# Patient Record
Sex: Female | Born: 1940
Health system: Southern US, Community
[De-identification: ages and names within clinical notes are randomized; demographics above are authoritative.]

## PROBLEM LIST (undated history)

## (undated) DIAGNOSIS — Z974 Presence of external hearing-aid: Secondary | ICD-10-CM

## (undated) DIAGNOSIS — T7840XA Allergy, unspecified, initial encounter: Secondary | ICD-10-CM

## (undated) DIAGNOSIS — M1712 Unilateral primary osteoarthritis, left knee: Secondary | ICD-10-CM

## (undated) DIAGNOSIS — M797 Fibromyalgia: Secondary | ICD-10-CM

## (undated) DIAGNOSIS — E785 Hyperlipidemia, unspecified: Secondary | ICD-10-CM

## (undated) DIAGNOSIS — B359 Dermatophytosis, unspecified: Secondary | ICD-10-CM

## (undated) DIAGNOSIS — E119 Type 2 diabetes mellitus without complications: Secondary | ICD-10-CM

## (undated) DIAGNOSIS — J45909 Unspecified asthma, uncomplicated: Secondary | ICD-10-CM

## (undated) DIAGNOSIS — G4730 Sleep apnea, unspecified: Secondary | ICD-10-CM

## (undated) DIAGNOSIS — E669 Obesity, unspecified: Secondary | ICD-10-CM

## (undated) DIAGNOSIS — M1610 Unilateral primary osteoarthritis, unspecified hip: Secondary | ICD-10-CM

## (undated) DIAGNOSIS — M722 Plantar fascial fibromatosis: Secondary | ICD-10-CM

## (undated) DIAGNOSIS — K219 Gastro-esophageal reflux disease without esophagitis: Secondary | ICD-10-CM

## (undated) DIAGNOSIS — I10 Essential (primary) hypertension: Secondary | ICD-10-CM

## (undated) HISTORY — DX: Fibromyalgia: M79.7

## (undated) HISTORY — DX: Plantar fascial fibromatosis: M72.2

## (undated) HISTORY — DX: Unilateral primary osteoarthritis, unspecified hip: M16.10

## (undated) HISTORY — DX: Unspecified asthma, uncomplicated: J45.909

## (undated) HISTORY — DX: Unilateral primary osteoarthritis, left knee: M17.12

## (undated) HISTORY — DX: Allergy, unspecified, initial encounter: T78.40XA

## (undated) HISTORY — DX: Essential (primary) hypertension: I10

## (undated) HISTORY — PX: BILATERAL CARPAL TUNNEL RELEASE: SHX6508

## (undated) HISTORY — DX: Type 2 diabetes mellitus without complications: E11.9

## (undated) HISTORY — DX: Dermatophytosis, unspecified: B35.9

## (undated) HISTORY — PX: HALLUX VALGUS CORRECTION: SUR315

## (undated) HISTORY — DX: Hyperlipidemia, unspecified: E78.5

## (undated) HISTORY — DX: Obesity, unspecified: E66.9

## (undated) HISTORY — PX: DILATION AND CURETTAGE OF UTERUS: SHX78

---

## 2004-04-11 ENCOUNTER — Other Ambulatory Visit: Payer: Self-pay

## 2010-11-14 ENCOUNTER — Ambulatory Visit: Payer: Self-pay

## 2011-11-06 ENCOUNTER — Ambulatory Visit: Payer: Self-pay

## 2011-11-19 ENCOUNTER — Ambulatory Visit: Payer: Self-pay | Admitting: Obstetrics and Gynecology

## 2011-11-19 DIAGNOSIS — I10 Essential (primary) hypertension: Secondary | ICD-10-CM

## 2011-11-19 LAB — BASIC METABOLIC PANEL
Anion Gap: 7 (ref 7–16)
Calcium, Total: 9.6 mg/dL (ref 8.5–10.1)
Co2: 31 mmol/L (ref 21–32)
Creatinine: 0.82 mg/dL (ref 0.60–1.30)
EGFR (African American): >60
EGFR (Non-African Amer.): >60
Glucose: 106 mg/dL — ABNORMAL HIGH (ref 65–99)
Osmolality: 283 (ref 275–301)

## 2011-11-19 LAB — URINALYSIS, COMPLETE
Blood: NEGATIVE
Glucose,UR: NEGATIVE mg/dL (ref 0–75)
Ketone: NEGATIVE
Nitrite: NEGATIVE
Ph: 6 (ref 4.5–8.0)
Protein: NEGATIVE
WBC UR: 46 /HPF (ref 0–5)

## 2011-11-19 LAB — HEMOGLOBIN: HGB: 13.9 g/dL (ref 12.0–16.0)

## 2011-11-26 ENCOUNTER — Ambulatory Visit: Payer: Self-pay | Admitting: Obstetrics and Gynecology

## 2011-11-27 LAB — PATHOLOGY REPORT

## 2012-01-26 HISTORY — PX: ABDOMINAL HYSTERECTOMY: SHX81

## 2012-02-05 ENCOUNTER — Ambulatory Visit: Payer: Self-pay | Admitting: Obstetrics and Gynecology

## 2012-02-05 LAB — URINALYSIS, COMPLETE
Bilirubin,UR: NEGATIVE
Blood: NEGATIVE
Glucose,UR: NEGATIVE mg/dL (ref 0–75)
Ketone: NEGATIVE
Leukocyte Esterase: NEGATIVE
Nitrite: NEGATIVE
Protein: NEGATIVE
Specific Gravity: 1.006 (ref 1.003–1.030)
WBC UR: <1 /HPF (ref 0–5)

## 2012-02-05 LAB — POTASSIUM: Potassium: 4.2 mmol/L (ref 3.5–5.1)

## 2012-02-11 ENCOUNTER — Ambulatory Visit: Payer: Self-pay | Admitting: Obstetrics and Gynecology

## 2012-02-14 LAB — PATHOLOGY REPORT

## 2014-07-07 ENCOUNTER — Ambulatory Visit: Payer: Self-pay | Admitting: Family Medicine

## 2014-12-19 NOTE — Op Note (Signed)
PATIENT NAME:  Carrie Wong, Carrie Wong MR#:  [id number] DATE OF BIRTH:  1940-11-24  DATE OF PROCEDURE:  11/26/2011  PREOPERATIVE DIAGNOSES:  1. Recurrent polyps. 2. Postmenopausal bleeding.   POSTOPERATIVE DIAGNOSES:  1. Recurrent polyps. 2. Postmenopausal bleeding.   PROCEDURES:  1. Hysteroscopy.  2. Dilation and curettage.  3. NovaSure ablation.   SURGEON: Ricky L. Amalia Hailey, MD  ANESTHESIA: General orotracheal.   FINDINGS: Small fleshy polyp minimal curettings otherwise. NovaSure settings showed a cavity length of 4, cavity width of 4.3, power 95 and time of cycle 1 minute, 14 seconds.   ESTIMATED BLOOD LOSS: Minimal.   COMPLICATIONS: None.   DRAINS: In and out catheter with red rubber at the end of the case. Approximately 100 mL of clear urine.   PROCEDURE IN DETAIL: Patient had recurrent polyps and bleeding as noted above. Thickened endometrium on ultrasound. Discussed options. Elected to proceed with above procedure with understanding that if pathology came back worrisome for cancer would come back and do a hysterectomy. Consent signed. Preoperative antibiotics given. Taken to the Operating Room, placed in supine position where anesthesia was initiated and placed in dorsal lithotomy position using candy cane stirrups, prepped and draped in usual sterile fashion. Cervix was visualized, grasped with single-tooth tenaculum, easily dilated to admit a #17 [place] and scope was inserted with findings as noted above and picture of polyp was taken. Polyp forceps then used to remove polyp. Generalized global curetting was carried out with sharp curette and then NovaSure was carried out in the fashion as described above without difficulty. Cavity test was passed first attempt.  All instruments removed. Bladder was drained. Cervix was seen to be hemostatic.   Was noted to be fairly significant relaxation with cervical descensus, rectocele. and a cystocele.  Patient tolerated procedure well, left in  care of anesthesia. Anticipate a routine postoperative course.   ____________________________ Rockey Situ. Amalia Hailey, MD rle:cms D: 11/26/2011 07:55:04 ET T: 11/26/2011 08:24:55 ET JOB#: [id number]  cc: Ricky L. Amalia Hailey, MD, <Dictator> Selmer Dominion MD ELECTRONICALLY SIGNED 11/27/2011 20:54

## 2014-12-19 NOTE — Op Note (Signed)
PATIENT NAME:  Carrie Wong, Carrie Wong MR#:  [id number] DATE OF BIRTH:  08-04-1941  DATE OF PROCEDURE:  02/11/2012  PREOPERATIVE DIAGNOSIS: Complex endometrial hyperplasia.   POSTOPERATIVE DIAGNOSIS: Complex endometrial hyperplasia.   PROCEDURE: Total vaginal hysterectomy.   SURGEON: Ricky L. Amalia Hailey, M.D.   ASSISTANT: Scrub tech.   ANESTHESIA: General endotracheal by Dr. Boston Service.   FINDINGS: Moderate relaxation, senile uterus, grossly normal tubes and ovaries.   ESTIMATED BLOOD LOSS: 200.   COMPLICATIONS: None.   SPECIMENS: Uterus.   PROCEDURE IN DETAIL: The patient was given the diagnosis of complex endometrial hyperplasia. We discussed options and she elected total vaginal hysterectomy. Consent was signed. She was taken to the Operating Room and placed in the supine position where anesthesia was initiated. She was then placed in the dorsal lithotomy position using Allen stirrups, prepped and draped in the usual sterile fashion. The cervix was visualized and grasped with a single-tooth tenaculum and injected circumferentially with approximately 12 mL of dilute vasopressin. A #15 blade was used to score the cervix circumferentially and direct entry was made in the posterior cul-de-sac. The cuff was tagged and a long weighted was placed.   We then made entry anteriorly and proceeded, using Heaney-Ballantine and 0 Vicryl, in a clamp, cut and tie technique to deliver the uterus in the usual fashion. Stumps were inspected for hemostasis and seen to be excellent. The fascia was closed with running interlocking 0 Vicryl.   A Foley catheter was placed at the end of the case with good spillage of clear urine.   The patient tolerated the procedure well. All instrument, needle, and sponge counts were correct. I anticipate a routine postoperative course and await permanent pathology.  ____________________________ Rockey Situ. Amalia Hailey, MD rle:slb D: 02/11/2012 10:19:53 ET T: 02/11/2012 11:42:32  ET JOB#: [id number]  cc: Ricky L. Amalia Hailey, MD, <Dictator> Selmer Dominion MD ELECTRONICALLY SIGNED 02/13/2012 4:44

## 2015-02-03 ENCOUNTER — Ambulatory Visit (INDEPENDENT_AMBULATORY_CARE_PROVIDER_SITE_OTHER): Payer: Medicare Other | Admitting: Family Medicine

## 2015-02-03 ENCOUNTER — Other Ambulatory Visit: Payer: Self-pay | Admitting: Family Medicine

## 2015-02-03 ENCOUNTER — Encounter: Payer: Self-pay | Admitting: Family Medicine

## 2015-02-03 VITALS — BP 131/78 | HR 61 | Temp 97.8°F | Wt 237.0 lb

## 2015-02-03 DIAGNOSIS — I10 Essential (primary) hypertension: Secondary | ICD-10-CM | POA: Insufficient documentation

## 2015-02-03 DIAGNOSIS — E785 Hyperlipidemia, unspecified: Secondary | ICD-10-CM

## 2015-02-03 DIAGNOSIS — M797 Fibromyalgia: Secondary | ICD-10-CM

## 2015-02-03 DIAGNOSIS — M1712 Unilateral primary osteoarthritis, left knee: Secondary | ICD-10-CM | POA: Insufficient documentation

## 2015-02-03 DIAGNOSIS — J302 Other seasonal allergic rhinitis: Secondary | ICD-10-CM

## 2015-02-03 DIAGNOSIS — D485 Neoplasm of uncertain behavior of skin: Secondary | ICD-10-CM

## 2015-02-03 DIAGNOSIS — J309 Allergic rhinitis, unspecified: Secondary | ICD-10-CM | POA: Insufficient documentation

## 2015-02-03 MED ORDER — MONTELUKAST SODIUM 10 MG PO TABS: 10.0000 mg | ORAL_TABLET | Freq: Every day | ORAL | Status: DC

## 2015-02-03 MED ORDER — AZELASTINE-FLUTICASONE 137-50 MCG/ACT NA SUSP: 1.0000 | Freq: Two times a day (BID) | NASAL | Status: AC

## 2015-02-03 MED ORDER — ESCITALOPRAM OXALATE 10 MG PO TABS: 15.0000 mg | ORAL_TABLET | Freq: Every day | ORAL | Status: DC

## 2015-02-03 MED ORDER — LOSARTAN POTASSIUM-HCTZ 100-25 MG PO TABS: 1.0000 | ORAL_TABLET | Freq: Every day | ORAL | Status: DC

## 2015-02-03 MED ORDER — METOPROLOL SUCCINATE ER 50 MG PO TB24: 50.0000 mg | ORAL_TABLET | Freq: Every day | ORAL | Status: DC

## 2015-02-03 NOTE — Assessment & Plan Note (Signed)
Continue meds; check creatinine and K+; weight loss encouraged

## 2015-02-03 NOTE — Patient Instructions (Addendum)
Please do contact a counselor for therapy regarding the anxiety and stress and sleep issues I do not recommend increasing alcohol or using sleeping pills Okay to use 2,000 mg of Tylenol (acetaminophen) a day for aches and pain I do recommend water therapy for weight loss and fibromyalgia Let's see what your cholesterol is today and then we'll start a new medicine and then recheck your cholesterol after 8-10 weeks Do limit eggs to no more than 3 per week Avoid / limit saturated fats, and try to get more whole grains and vegetables and fruits Do call to schedule your own mammogram please We'll refer you to the dermatologist Okay to add claritin to the generic Singulair   Cholesterol Cholesterol is a fat. Your body needs a small amount of cholesterol. Cholesterol may build up in your blood vessels. This increases your chance of having a heart attack or stroke. You cannot feel your cholesterol levels. The only way to know your cholesterol level is high is with a blood test. Keep your test results. Work with your doctor to keep your cholesterol at a good level. WHAT DO THE TEST RESULTS MEAN?  Total cholesterol is how much cholesterol is in your blood.  LDL is bad cholesterol. This is the type that can build up. You want LDL to be low.  HDL is good cholesterol. It cleans your blood vessels and carries LDL away. You want HDL to be high.  Triglycerides are fat that the body can burn for energy or store. WHAT ARE GOOD LEVELS OF CHOLESTEROL?  Total cholesterol below 200.  LDL below 100 for people at risk. Below 70 for those at very high risk.  HDL above 50 is good. Above 60 is best.  Triglycerides below 150. HOW CAN I LOWER MY CHOLESTEROL?  Diet. Follow your diet programs as told by your doctor.  Choose fish, white meat chicken, roasted [place], or baked [place]. Try not to eat red meat, fried foods, or processed meats such as sausage and lunch meats.  Eat lots of fresh fruits and  vegetables.  Choose whole grains, beans, pasta, potatoes, and cereals.  Use only small amounts of olive, corn, or canola oils.  Try not to eat butter, mayonnaise, shortening, or palm kernel oils.  Try not to eat foods with trans fats.  Drink skim or nonfat milk. Eat low-fat or nonfat yogurt and cheeses. Try not to drink whole milk or cream. Try not to eat ice cream, egg yolks, and full-fat cheeses.  Healthy desserts include angel food cake, ginger snaps, animal crackers, hard candy, popsicles, and low-fat or nonfat frozen yogurt. Try not to eat pastries, cakes, pies, and cookies.  Exercise. Follow your exercise programs as told by your doctor.  Be more active. You can try gardening, walking, or taking the stairs. Ask your doctor about how you can be more active.  Medicine. Take medicine as told by your doctor. Document Released: 11/09/2008 Document Revised: 12/28/2013 Document Reviewed: 05/27/2013 [facility] Patient Information 2015 [place], [place]. This information is not intended to replace advice given to you by your health care provider. Make sure you discuss any questions you have with your health care provider.

## 2015-02-03 NOTE — Assessment & Plan Note (Signed)
Continue the montelukast; may add OTC claritin, explained different steps in pathway, okay to take together

## 2015-02-03 NOTE — Telephone Encounter (Signed)
Waiting on the lipid panel and liver function tests to come back, and then I'll prescribe cholesterol medicine

## 2015-02-03 NOTE — Assessment & Plan Note (Signed)
Explained the number one recommendation is weight loss; suggested pool therapy; tylenol is safe for her to take up to 2,000 mg daily; may continue turmeric; she declined offer for additional imaging

## 2015-02-03 NOTE — Progress Notes (Signed)
Patient ID: Carrie Wong, female   DOB: 15-Mar-1941, 74 y.o.   MRN: [id number]   Subjective:   Carrie Wong is a 74 y.o. female here for follow-up today  She has several issues She says she needs "something for anxiety" and wants to know if I can give her what her sister takes; she has duloxetine 30 mg and clonazepam 1 mg written down on a piece of paper; she has trouble getting ready to go to sleep, but once she falls asleep, seems to be able to stay asleep; she does not do her bills at bedtime, but does spend time on the computer before bed; she drinks one alcoholic drink per day  She has pain in her left knee from arthritis; she has had imaging done and does not want any more xrays; there is really not much pain when she is just seated; using a cane; taking turmeric; no Aleve; just an occasional tyelnol; she can't exercise; she also has plantar fasciitis in her right foot and has seen podiatrist; she had right hip pain but that pain is better  She has high cholesterol but has not been taking her medicine because of cost; she asked if she could try another generic medicine which would be cheaper; she eats about 6 eggs per week, limits saturated fats somewhat; she drinks skim milk  She has some bumps under her breasts; on the skin, not in the breast; she has not had her mammogram yet (not UTD)  Allergies are bothersome and she wants to know if she can switch from the montelukast to Claritin  Past Medical History  Diagnosis Date  . Obesity   . Diabetes mellitus without complication   . Hyperlipidemia   . Hypertension   . Allergy   . Asthma   . Fibromyalgia   . Tinea   . Plantar fasciitis   . Hip arthritis   . Arthritis of knee, left   . Fibromyalgia    Past Surgical History  Procedure Laterality Date  . Dilation and curettage of uterus    . Abdominal hysterectomy  June 2013    Total vaginal for precancerous cells (ovaries remain)   Family History  Problem Relation Age  of Onset  . Hypertension Mother   . Heart disease Mother 23    Bypass surgery  . Cancer Father     lung  . Heart disease Father 59    bypass surgery   History  Substance Use Topics  . Smoking status: Former Smoker -- 0.50 packs/day for 10 years    Types: Cigarettes    Quit date: 12/26/1966  . Smokeless tobacco: Never Used  . Alcohol Use: 4.2 oz/week    7 Cans of beer per week     Comment: "drinks for her kidneys"     Current outpatient prescriptions:  .  aspirin EC 81 MG tablet, Take 81 mg by mouth daily., Disp: , Rfl:  .  Azelastine-Fluticasone 137-50 MCG/ACT SUSP, Place into the nose., Disp: , Rfl:  .  clotrimazole-betamethasone (LOTRISONE) cream, Apply topically., Disp: , Rfl:  .  escitalopram (LEXAPRO) 10 MG tablet, Take 10 mg by mouth daily. Take 1.5 tabs daily, Disp: , Rfl:  .  losartan-hydrochlorothiazide (HYZAAR) 100-25 MG per tablet, Take 1 tablet by mouth daily., Disp: , Rfl:  .  Melatonin 3 MG CAPS, Take by mouth at bedtime., Disp: , Rfl:  .  metoprolol succinate (TOPROL-XL) 50 MG 24 hr tablet, Take 50 mg by mouth daily. Take  with or immediately following a meal., Disp: , Rfl:  .  montelukast (SINGULAIR) 10 MG tablet, Take 10 mg by mouth at bedtime., Disp: , Rfl:  .  rosuvastatin (CRESTOR) 5 MG tablet, Take 5 mg by mouth daily. Take 1 every other night, Disp: , Rfl:     NOT taking rosuvastatin  Allergies  Allergen Reactions  . Amlodipine Besylate Swelling   ------------- Review of Systems  Constitutional: Negative for weight loss.  Musculoskeletal: Positive for myalgias and joint pain (left knee).  Skin: Positive for rash (under the breasts).  Endo/Heme/Allergies: Positive for environmental allergies (using montelukast; asked if okay to take Claritin).  Psychiatric/Behavioral: Negative for depression. The patient is nervous/anxious and has insomnia.     No results found for: CHOL, HDL, LDLCALC, LDLDIRECT, TRIG, CHOLHDL No results found for: HGBA1C No results  found for: Derl Barrow Lab Results  Component Value Date   NA 141 11/19/2011   K 4.2 02/05/2012   CL 103 11/19/2011   CO2 31 11/19/2011   Lab Results  Component Value Date   CREATININE 0.82 11/19/2011   Lab Results  Component Value Date   HGB 13.3 02/05/2012   HCT 35.6 02/12/2012   Objective:   Filed Vitals:   02/03/15 0810  BP: 131/78  Pulse: 61  Temp: 97.8 F (36.6 C)  Weight: 237 lb (107.502 kg)  SpO2: 96%   Body mass index is 38.04 kg/(m^2). Wt Readings from Last 3 Encounters:  02/03/15 237 lb (107.502 kg)  08/06/14 236 lb (107.049 kg)   Physical Exam  Constitutional: She appears well-developed and well-nourished.  obese  HENT:  Head: Normocephalic and atraumatic.  Mucous membranes moist  Eyes: EOM are normal.  Neck: No thyromegaly present.  Cardiovascular: Normal rate, regular rhythm and normal heart sounds.   No murmur heard. Pulmonary/Chest: Effort normal and breath sounds normal. No respiratory distress. She has no wheezes. Right breast exhibits no inverted nipple, no mass, no nipple discharge, no skin change and no tenderness. Left breast exhibits no inverted nipple, no mass, no nipple discharge, no skin change and no tenderness. Breasts are symmetrical.  Abdominal: Soft. She exhibits no distension.  nondistended  Musculoskeletal: She exhibits no edema or tenderness (no pain with palpation over the left knee; there is crepitus with active flexion/extension).  No gross deformities  Neurological: She is alert. Coordination normal.  No tics or tremors  Skin: Skin is warm and dry. No rash (keratotic lesions under both breasts; macular brown lesions, one on each areola (new per patient)) noted.  Psychiatric: Her mood appears not anxious. Her speech is not slurred. She is not agitated, not aggressive and not hyperactive. Cognition and memory are not impaired. She does not exhibit a depressed mood. She exhibits normal recent memory.  Good eye contact  with examiner    Assessment/Plan:   Problem List Items Addressed This Visit      Cardiovascular and Mediastinum   Benign hypertension    Continue meds; check creatinine and K+; weight loss encouraged        Respiratory   Allergic rhinitis    Continue the montelukast; may add OTC claritin, explained different steps in pathway, okay to take together        Musculoskeletal and Integument   Primary osteoarthritis of left knee - Primary    Explained the number one recommendation is weight loss; suggested pool therapy; tylenol is safe for her to take up to 2,000 mg daily; may continue turmeric; she declined offer for  additional imaging      Fibromyalgia     Other   Hyperlipidemia    Will see what today's fasting lipid panel is and then start generic atorvastatin (most likely); limit eggs to no more than 3 per week; limit saturated fats      Relevant Orders   Comprehensive metabolic panel   Lipid panel    Other Visit Diagnoses    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 clotrimazole-betamethasone (LOTRISONE) cream    Sig: Apply topically.  . Melatonin 3 MG CAPS    Sig: Take by mouth at bedtime.   Orders Placed This Encounter  Procedures  . Comprehensive metabolic panel  . Lipid panel  . Ambulatory referral to Dermatology    Referral Priority:  Routine    Referral Type:  Consultation    Referral Reason:  Specialty Services Required    Requested Specialty:  Dermatology    Number of Visits Requested:  1    Follow up plan: Return in about 10 weeks (around 04/14/2015).   An After Visit Summary was printed and given to the patient.

## 2015-02-03 NOTE — Assessment & Plan Note (Signed)
Will see what today's fasting lipid panel is and then start generic atorvastatin (most likely); limit eggs to no more than 3 per week; limit saturated fats

## 2015-02-04 ENCOUNTER — Telehealth: Payer: Self-pay | Admitting: Family Medicine

## 2015-02-04 DIAGNOSIS — E785 Hyperlipidemia, unspecified: Secondary | ICD-10-CM

## 2015-02-04 LAB — COMPREHENSIVE METABOLIC PANEL
A/G RATIO: 1.7 (ref 1.1–2.5)
ALBUMIN: 4.3 g/dL (ref 3.5–4.8)
ALT: 21 IU/L (ref 0–32)
AST: 18 IU/L (ref 0–40)
Alkaline Phosphatase: 52 IU/L (ref 39–117)
BUN/Creatinine Ratio: 19 (ref 11–26)
BUN: 14 mg/dL (ref 8–27)
Bilirubin Total: <0.2 mg/dL (ref 0.0–1.2)
CO2: 27 mmol/L (ref 18–29)
Calcium: 10 mg/dL (ref 8.7–10.3)
Chloride: 99 mmol/L (ref 97–108)
Creatinine, Ser: 0.73 mg/dL (ref 0.57–1.00)
GFR calc Af Amer: 95 mL/min/{1.73_m2} (ref >59)
GFR calc non Af Amer: 83 mL/min/{1.73_m2} (ref >59)
GLOBULIN, TOTAL: 2.6 g/dL (ref 1.5–4.5)
Glucose: 113 mg/dL — ABNORMAL HIGH (ref 65–99)
POTASSIUM: 3.9 mmol/L (ref 3.5–5.2)
Sodium: 140 mmol/L (ref 134–144)
Total Protein: 6.9 g/dL (ref 6.0–8.5)

## 2015-02-04 LAB — LIPID PANEL
Chol/HDL Ratio: 5.9 ratio units — ABNORMAL HIGH (ref 0.0–4.4)
Cholesterol, Total: 241 mg/dL — ABNORMAL HIGH (ref 100–199)
HDL: 41 mg/dL (ref >39)
LDL Calculated: 155 mg/dL — ABNORMAL HIGH (ref 0–99)
TRIGLYCERIDES: 223 mg/dL — AB (ref 0–149)
VLDL Cholesterol Cal: 45 mg/dL — ABNORMAL HIGH (ref 5–40)

## 2015-02-04 MED ORDER — SIMVASTATIN 10 MG PO TABS: 10.0000 mg | ORAL_TABLET | Freq: Every day | ORAL | Status: DC

## 2015-02-04 NOTE — Telephone Encounter (Signed)
Let pt know cholesterol is high, but liver function tests are normal Let's start low dose simvastatin (already sent Rx) Return for FASTING labs in 8 weeks (orders entered) Really work on low fat diet, limiting saturated fats, more fiber and veggies, weight loss

## 2015-02-04 NOTE — Telephone Encounter (Signed)
Pt. Notified.

## 2015-03-29 ENCOUNTER — Other Ambulatory Visit: Payer: Self-pay | Admitting: Family Medicine

## 2015-03-30 ENCOUNTER — Other Ambulatory Visit: Payer: Self-pay

## 2015-03-30 NOTE — Telephone Encounter (Signed)
Needs 90 day supply for mail order. 

## 2015-03-30 NOTE — Telephone Encounter (Signed)
She had rx's sent to local pharmacy, needs them sent to mail order.

## 2015-03-31 MED ORDER — SIMVASTATIN 10 MG PO TABS: 10.0000 mg | ORAL_TABLET | Freq: Every day | ORAL | Status: DC

## 2015-03-31 NOTE — Telephone Encounter (Signed)
Please make sure she comes in for her labs in August

## 2015-04-14 ENCOUNTER — Encounter: Payer: Self-pay | Admitting: Family Medicine

## 2015-04-14 ENCOUNTER — Ambulatory Visit (INDEPENDENT_AMBULATORY_CARE_PROVIDER_SITE_OTHER): Payer: Medicare Other | Admitting: Family Medicine

## 2015-04-14 VITALS — BP 138/79 | HR 64 | Temp 98.7°F | Ht 66.0 in | Wt 239.0 lb

## 2015-04-14 DIAGNOSIS — I10 Essential (primary) hypertension: Secondary | ICD-10-CM | POA: Diagnosis not present

## 2015-04-14 DIAGNOSIS — Z5181 Encounter for therapeutic drug level monitoring: Secondary | ICD-10-CM | POA: Insufficient documentation

## 2015-04-14 DIAGNOSIS — J45909 Unspecified asthma, uncomplicated: Secondary | ICD-10-CM | POA: Insufficient documentation

## 2015-04-14 DIAGNOSIS — E785 Hyperlipidemia, unspecified: Secondary | ICD-10-CM | POA: Diagnosis not present

## 2015-04-14 DIAGNOSIS — N393 Stress incontinence (female) (male): Secondary | ICD-10-CM | POA: Insufficient documentation

## 2015-04-14 DIAGNOSIS — Z1239 Encounter for other screening for malignant neoplasm of breast: Secondary | ICD-10-CM | POA: Diagnosis not present

## 2015-04-14 DIAGNOSIS — J3089 Other allergic rhinitis: Secondary | ICD-10-CM

## 2015-04-14 LAB — LIPID PANEL PICCOLO, WAIVED
Chol/HDL Ratio Piccolo,Waive: 5.3 mg/dL — ABNORMAL HIGH
Cholesterol Piccolo, Waived: 201 mg/dL — ABNORMAL HIGH
HDL Chol Piccolo, Waived: 38 mg/dL — ABNORMAL LOW
LDL Chol Calc Piccolo Waived: 127 mg/dL — ABNORMAL HIGH
Triglycerides Piccolo,Waived: 182 mg/dL — ABNORMAL HIGH
VLDL Chol Calc Piccolo,Waive: 36 mg/dL — ABNORMAL HIGH

## 2015-04-14 LAB — ALT (SGPT) PICCOLO, WAIVED: ALT (SGPT) PICCOLO, WAIVED: 24 U/L (ref 10–47)

## 2015-04-14 MED ORDER — SIMVASTATIN 10 MG PO TABS: 10.0000 mg | ORAL_TABLET | Freq: Every day | ORAL | Status: DC

## 2015-04-14 NOTE — Assessment & Plan Note (Signed)
Continue same meds; controlled today; DASH guidelines, modest weight loss

## 2015-04-14 NOTE — Assessment & Plan Note (Signed)
Having a little flare now affecting postnasal drip

## 2015-04-14 NOTE — Assessment & Plan Note (Signed)
Wearing pads; not interfering with activities; do Kegel exercise

## 2015-04-14 NOTE — Progress Notes (Signed)
BP 138/79 mmHg  Pulse 64  Temp(Src) 98.7 F (37.1 C)  Ht 5\' 6"  (1.676 m)  Wt 239 lb (108.41 kg)  BMI 38.59 kg/m2  SpO2 97%   Subjective:    Patient ID: Carrie Wong, female    DOB: 03-30-1941, 74 y.o.   MRN: [id number]  HPI: Carrie Wong is a 74 y.o. female  Chief Complaint  Patient presents with  . Hyperlipidemia   She was here in June and was found to have high cholesterol; started on medicine simvastatin; no side effects like muscle aches or abdominal She is doing better with eggs and saturated fat intake; she is limiting eggs to no more than three per week; that was a habit for her, to have two eggs at a time three times a week or more often; if she didn't have eggs in the morning, would have them later in the day She did come fasting this morning Her blood pressure is controlled on medications  She apparently had a Medicare Wellness visit done by [employer] on April 07, 2015 and she brought in several pages of information She is supposed to follow-up on 1.  Possible memory impairment 2.  Abnormal depression screening 3.  Increased risk of falling 4.  Bladder concerns 5.  Chronic pain 6.  Sleeping problems We covered all of these at her other visit actually She is also supposed to supply me with a copy of her advanced directives  Her bladder issues now are "normal"; wears a pad because she coughs and loses a little urine then She was on pills for depression and they weren't doing that much good, then the dose was increased to 1.5 pills but her insurance wouldn't pay for that dose, then they sent it to her, then she started taking it finally; 1.5 pills daily; she decided to do counseling instead of medication; she does not feel depressed; does not feel sad; she wasn't sleeping as well because of the underlying mood issue; she has started with a friend, Darrick Meigs friend and mentor, not a Social worker; has helped her through some hard times; not  feeling depressed She does not feel like she is having any major problems with her memory; might forget a word; happens to everybody; she does not get lost while driving  Relevant past medical, surgical, family and social history reviewed and updated as indicated. Interim medical history since our last visit reviewed. Allergies and medications reviewed and updated.  Review of Systems  Constitutional: Negative for fever and chills.  HENT: Positive for postnasal drip.        Dry throat  Eyes: Positive for discharge (watering).  Respiratory: Positive for cough (a little productive just recently) and wheezing (just a little wheeze recently with postnasal drip).   Psychiatric/Behavioral: Negative for dysphoric mood.   Per HPI unless specifically indicated above     Objective:    BP 138/79 mmHg  Pulse 64  Temp(Src) 98.7 F (37.1 C)  Ht 5\' 6"  (1.676 m)  Wt 239 lb (108.41 kg)  BMI 38.59 kg/m2  SpO2 97%  Wt Readings from Last 3 Encounters:  04/14/15 239 lb (108.41 kg)  02/03/15 237 lb (107.502 kg)  08/06/14 236 lb (107.049 kg)    Physical Exam  Constitutional: She appears well-developed and well-nourished. No distress.  HENT:  Head: Normocephalic and atraumatic.  Eyes: EOM are normal. No scleral icterus.  Neck: No thyromegaly present.  Cardiovascular: Normal rate, regular rhythm and normal heart sounds.  No murmur heard. Pulmonary/Chest: Effort normal and breath sounds normal. No respiratory distress. She has no wheezes. She has no rales.  Abdominal: Soft. Bowel sounds are normal. She exhibits no distension.  Musculoskeletal: Normal range of motion. She exhibits no edema.  Lymphadenopathy:    She has no cervical adenopathy.  Neurological: She is alert. She exhibits normal muscle tone.  Skin: Skin is warm and dry. She is not diaphoretic. No pallor.  Psychiatric: She has a normal mood and affect. Her behavior is normal. Judgment and thought content normal.   Results for orders  placed or performed in visit on 02/03/15  Comprehensive metabolic panel  Result Value Ref Range   Glucose 113 (H) 65 - 99 mg/dL   BUN 14 8 - 27 mg/dL   Creatinine, Ser 0.73 0.57 - 1.00 mg/dL   GFR calc non Af Amer 83 >59 mL/min/1.73   GFR calc Af Amer 95 >59 mL/min/1.73   BUN/Creatinine Ratio 19 11 - 26   Sodium 140 134 - 144 mmol/L   Potassium 3.9 3.5 - 5.2 mmol/L   Chloride 99 97 - 108 mmol/L   CO2 27 18 - 29 mmol/L   Calcium 10.0 8.7 - 10.3 mg/dL   Total Protein 6.9 6.0 - 8.5 g/dL   Albumin 4.3 3.5 - 4.8 g/dL   Globulin, Total 2.6 1.5 - 4.5 g/dL   Albumin/Globulin Ratio 1.7 1.1 - 2.5   Bilirubin Total <0.2 0.0 - 1.2 mg/dL   Alkaline Phosphatase 52 39 - 117 IU/L   AST 18 0 - 40 IU/L   ALT 21 0 - 32 IU/L  Lipid panel  Result Value Ref Range   Cholesterol, Total 241 (H) 100 - 199 mg/dL   Triglycerides 223 (H) 0 - 149 mg/dL   HDL 41 >39 mg/dL   VLDL Cholesterol Cal 45 (H) 5 - 40 mg/dL   LDL Calculated 155 (H) 0 - 99 mg/dL   Chol/HDL Ratio 5.9 (H) 0.0 - 4.4 ratio units      Assessment & Plan:   Problem List Items Addressed This Visit      Cardiovascular and Mediastinum   Benign hypertension    Continue same meds; controlled today; DASH guidelines, modest weight loss      Relevant Medications   simvastatin (ZOCOR) 10 MG tablet     Respiratory   Allergic rhinitis    Having a little flare now affecting postnasal drip      Asthma due to environmental allergies    Avoid triggers; use singulair and rescue inhaler        Other   Hyperlipidemia - Primary    Check lipids today; continue statin; limit eggs and saturated fats; LDL today down from 163 to 127; SGPT normal      Relevant Medications   simvastatin (ZOCOR) 10 MG tablet   Other Relevant Orders   Lipid Panel Piccolo, Waived   Medication monitoring encounter    Check liver enzymes today      Relevant Orders   ALT (SGPT) Piccolo, Waived   Breast cancer screening    Order entered for mammogram       Relevant Orders   MM DIGITAL SCREENING BILATERAL   Urinary, incontinence, 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  An after-visit summary was printed and given to the patient at [facility].  Please see the  patient instructions which may contain other information and recommendations beyond what is mentioned above in the assessment and plan.  Meds ordered this encounter  Medications  . simvastatin (ZOCOR) 10 MG tablet    Sig: Take 1 tablet (10 mg total) by mouth at bedtime.    Dispense:  90 tablet    Refill:  1   Orders Placed This Encounter  Procedures  . MM DIGITAL SCREENING BILATERAL  . Lipid Panel Piccolo, [place]  . ALT (SGPT) Piccolo, [place]

## 2015-04-14 NOTE — Patient Instructions (Addendum)
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

## 2015-04-14 NOTE — Assessment & Plan Note (Addendum)
Check lipids today; continue statin; limit eggs and saturated fats; LDL today down from 163 to 127; SGPT normal

## 2015-04-14 NOTE — Assessment & Plan Note (Addendum)
Order entered for mammogram.

## 2015-04-14 NOTE — Assessment & Plan Note (Signed)
Check liver enzymes today 

## 2015-04-14 NOTE — Assessment & Plan Note (Signed)
Avoid triggers; use singulair and rescue inhaler

## 2015-06-22 ENCOUNTER — Ambulatory Visit
Admission: RE | Admit: 2015-06-22 | Discharge: 2015-06-22 | Disposition: A | Discharge status: Home / Self Care | Payer: Medicare Other | Source: Ambulatory Visit | Attending: Family Medicine | Admitting: Family Medicine

## 2015-06-22 DIAGNOSIS — Z1231 Encounter for screening mammogram for malignant neoplasm of breast: Secondary | ICD-10-CM | POA: Insufficient documentation

## 2015-06-30 ENCOUNTER — Other Ambulatory Visit: Payer: Self-pay | Admitting: Family Medicine

## 2015-06-30 MED ORDER — METOPROLOL SUCCINATE ER 50 MG PO TB24: 50.0000 mg | ORAL_TABLET | Freq: Every day | ORAL | Status: DC

## 2015-06-30 MED ORDER — LOSARTAN POTASSIUM-HCTZ 100-25 MG PO TABS: 1.0000 | ORAL_TABLET | Freq: Every day | ORAL | Status: DC

## 2015-06-30 MED ORDER — SIMVASTATIN 10 MG PO TABS: 10.0000 mg | ORAL_TABLET | Freq: Every day | ORAL | Status: DC

## 2015-06-30 NOTE — Telephone Encounter (Signed)
I reviewed CMP, June 2016; lipids and SGPT August; I approved some more refills, but need to keep reminders to prevent too much medicine being continued without being monitored

## 2015-06-30 NOTE — Telephone Encounter (Signed)
She would like 90 day rx's with refills for the year. She has a refill remaining on the simvastatin, but I didn't know if you'd be able to give her more refills.

## 2015-06-30 NOTE — Telephone Encounter (Signed)
Patient notified

## 2015-06-30 NOTE — Telephone Encounter (Signed)
Pt needs refills for simvastatin, losartan and metoprolol sent to mail order. She would like to know if she could possibly have it done for a year instead of 90 days.

## 2015-09-26 ENCOUNTER — Other Ambulatory Visit: Payer: Self-pay | Admitting: Family Medicine

## 2015-09-26 NOTE — Telephone Encounter (Signed)
She wants to be seen for Fibromyalgia, since November, she's been worse and not sleeping, was on amitriptyline in the past. She says she does have problems with her knees.

## 2015-09-26 NOTE — Telephone Encounter (Signed)
Patient wants to know if she can get a referral to Rheumatology with Dr. Jefm Bryant. Please call pt [phone].

## 2015-09-27 NOTE — Telephone Encounter (Signed)
I'd recommend that we see her first; I don't know if Dr. Jefm Bryant will see patients for fibromyalgia; schedule her a dedicated visit with me for fibro (separate for her Feb visit for her other medicine problems)

## 2015-09-28 NOTE — Telephone Encounter (Signed)
Left 2 vm for pt to call us back and schedule an appointment for a referral to rheumatology separate from the one she already has schedule.

## 2015-09-29 MED ORDER — MONTELUKAST SODIUM 10 MG PO TABS: 10.0000 mg | ORAL_TABLET | Freq: Every day | ORAL | Status: AC

## 2015-09-29 MED ORDER — METOPROLOL SUCCINATE ER 50 MG PO TB24: 50.0000 mg | ORAL_TABLET | Freq: Every day | ORAL | Status: DC

## 2015-09-29 MED ORDER — SIMVASTATIN 10 MG PO TABS: 10.0000 mg | ORAL_TABLET | Freq: Every day | ORAL | Status: DC

## 2015-09-29 MED ORDER — LOSARTAN POTASSIUM-HCTZ 100-25 MG PO TABS: 1.0000 | ORAL_TABLET | Freq: Every day | ORAL | Status: DC

## 2015-09-29 NOTE — Telephone Encounter (Signed)
Labs from June and August 2016 reviewed; she has upcoming appt; refils approved

## 2015-09-29 NOTE — Telephone Encounter (Signed)
Routing to provider  

## 2015-09-29 NOTE — Telephone Encounter (Signed)
[facility] called stated pt is leaving mail order and transferring to [facility] in [place]. Pharm needs all RX's sent to them as new RX's.  Pt specifically needs: Simvastatin Monotelukast Losartan w/ HCTZ Generic Toporal   Pharm is [facility] in [place]. Thanks.

## 2015-10-18 ENCOUNTER — Ambulatory Visit (INDEPENDENT_AMBULATORY_CARE_PROVIDER_SITE_OTHER): Payer: PPO | Admitting: Family Medicine

## 2015-10-18 ENCOUNTER — Encounter: Payer: Self-pay | Admitting: Family Medicine

## 2015-10-18 VITALS — BP 131/88 | HR 68 | Temp 98.5°F | Ht 66.0 in | Wt 239.8 lb

## 2015-10-18 DIAGNOSIS — E669 Obesity, unspecified: Secondary | ICD-10-CM | POA: Diagnosis not present

## 2015-10-18 DIAGNOSIS — M25519 Pain in unspecified shoulder: Secondary | ICD-10-CM

## 2015-10-18 DIAGNOSIS — G4700 Insomnia, unspecified: Secondary | ICD-10-CM

## 2015-10-18 DIAGNOSIS — M25512 Pain in left shoulder: Secondary | ICD-10-CM

## 2015-10-18 DIAGNOSIS — I10 Essential (primary) hypertension: Secondary | ICD-10-CM

## 2015-10-18 DIAGNOSIS — R7301 Impaired fasting glucose: Secondary | ICD-10-CM | POA: Diagnosis not present

## 2015-10-18 DIAGNOSIS — Z5181 Encounter for therapeutic drug level monitoring: Secondary | ICD-10-CM

## 2015-10-18 DIAGNOSIS — G8929 Other chronic pain: Secondary | ICD-10-CM | POA: Diagnosis not present

## 2015-10-18 DIAGNOSIS — Z1211 Encounter for screening for malignant neoplasm of colon: Secondary | ICD-10-CM | POA: Diagnosis not present

## 2015-10-18 DIAGNOSIS — E785 Hyperlipidemia, unspecified: Secondary | ICD-10-CM | POA: Diagnosis not present

## 2015-10-18 DIAGNOSIS — M797 Fibromyalgia: Secondary | ICD-10-CM

## 2015-10-18 MED ORDER — TRAZODONE HCL 50 MG PO TABS: 25.0000 mg | ORAL_TABLET | Freq: Every evening | ORAL | Status: DC | PRN

## 2015-10-18 NOTE — Assessment & Plan Note (Addendum)
Check glucose and A1c; quit taking metformin on her own; limit simple carbs; work on weight loss

## 2015-10-18 NOTE — Assessment & Plan Note (Signed)
Controlled today on agents; try weight loss and following DASH guidelines

## 2015-10-18 NOTE — Patient Instructions (Addendum)
Let's get some labs today We'll refer you to the rheumatologist  I've ordered the Cologuard for colon cancer screening  If you have not heard anything from my staff in a week about any orders/referrals/studies from today, please contact us here to follow-up [phone]  Try to limit saturated fats in your diet (bologna, hot dogs, barbeque, cheeseburgers, hamburgers, steak, bacon, sausage, cheese, etc.) and get more fresh fruits, vegetables, and whole grains  Your goal blood pressure is less than 140 mmHg on top. Try to follow the DASH guidelines (DASH stands for Dietary Approaches to Stop Hypertension) Try to limit the sodium in your diet.  Ideally, consume less than 1.5 grams (less than 1,500mg ) per day. Do not add salt when cooking or at the table.  Check the sodium amount on labels when shopping, and choose items lower in sodium when given a choice. Avoid or limit foods that already contain a lot of sodium. Eat a diet rich in fruits and vegetables and whole grains.  Check out the information at familydoctor.org entitled "What It Takes to Lose Weight" Try to lose between 1-2 pounds per week by taking in fewer calories and burning off more calories You can succeed by limiting portions, limiting foods dense in calories and fat, becoming more active, and drinking 8 glasses of water a day (64 ounces) Don't skip meals, especially breakfast, as skipping meals may alter your metabolism Do not use over-the-counter weight loss pills or gimmicks that claim rapid weight loss A healthy BMI (or body mass index) is between 18.5 and 24.9 You can calculate your ideal BMI at the [place] website ClubMonetize.fr

## 2015-10-18 NOTE — Assessment & Plan Note (Signed)
Refer back to rheumatologist; discussed importance of sleep

## 2015-10-18 NOTE — Assessment & Plan Note (Signed)
Likely impacting her fibro; start trazodone

## 2015-10-18 NOTE — Assessment & Plan Note (Signed)
Encouraged weight loss; discussed that losing significant weight would help her knees, sugar, BP, lipids, etc.

## 2015-10-18 NOTE — Assessment & Plan Note (Signed)
Check sgpt on the statin 

## 2015-10-18 NOTE — Progress Notes (Signed)
BP 131/88 mmHg  Pulse 68  Temp(Src) 98.5 F (36.9 C)  Ht 5\' 6"  (1.676 m)  Wt 239 lb 12.8 oz (108.773 kg)  BMI 38.72 kg/m2  SpO2 97%   Subjective:    Patient ID: Carrie Wong, female    DOB: 1941-06-06, 75 y.o.   MRN: [id number]  HPI: Carrie Wong is a 75 y.o. female  Chief Complaint  Patient presents with  . Follow-up  . Referral    Talk about referral to Rheumatology. [place].  . Cologuard    Patient wants to do cologuard & not a colonoscopy.    She called about a referral to a rheumatologist; she saw rheum at [facility] 40 years ago, diagnosed with fibromyalgia; she quit taking amitriptyline, because she couldn't tell any difference; she is aching all over; she says everyone she knows with fibro take something for sleep; she tried melatonin, would go to sleep but wake back up in an hour; not able to be active because of pain, she says she has been sitting since Thanksgiving; using compression hose for her legs  Her knees were a major problem, then had trouble with her right hip, sore for months; the hip is all better now; she does not go up and down stairs with groceries any more because of her knees; 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  High cholesterol; taking statin; she came fasting; she does not eat more than 3 eggs per week; limiting foods with saturated fats Lab Results  Component Value Date   CHOL 241*  02/03/2015   Lab Results  Component Value Date   HDL 41 02/03/2015   Lab Results  Component Value Date   LDLCALC 155* 02/03/2015   Lab Results  Component Value Date   TRIG 223* 02/03/2015   Lab Results  Component Value Date   CHOLHDL 5.9* 02/03/2015   No results found for: LDLDIRECT   Relevant past medical, surgical, family and social history reviewed and updated as indicated. Interim medical history since our last visit reviewed. Allergies and medications reviewed and updated.  Review of Systems  Per HPI unless specifically indicated above     Objective:    BP 131/88 mmHg  Pulse 68  Temp(Src) 98.5 F (36.9 C)  Ht 5\' 6"  (1.676 m)  Wt 239 lb 12.8 oz (108.773 kg)  BMI 38.72 kg/m2  SpO2 97%  Wt Readings from Last 3 Encounters:  10/18/15 239 lb 12.8 oz (108.773 kg)  04/14/15 239 lb (108.41 kg)  02/03/15 237 lb (107.502 kg)    Physical Exam  Constitutional: She appears well-developed and well-nourished. No distress.  Eyes: EOM are normal. No scleral icterus.  Neck: No JVD present.  Cardiovascular: Normal rate and regular rhythm.   Pulmonary/Chest: Effort normal and breath sounds normal.  Abdominal: Bowel sounds are normal. She exhibits no distension.  Musculoskeletal:  Left shoulder: She exhibits decreased range of motion and tenderness. She exhibits no swelling, no crepitus and no deformity.  Neurological: She is alert.  Skin: No pallor.  Psychiatric: She has a normal mood and affect.    Results for orders placed or performed in visit on 04/14/15  Lipid Panel Piccolo, [employer]  Result Value Ref Range   Cholesterol Piccolo, Waived 201 (H) <200 mg/dL   HDL Chol Piccolo, Waived 38 (L) >59 mg/dL   Triglycerides Piccolo,Waived 182 (H) <150 mg/dL   Chol/HDL Ratio Piccolo,Waive 5.3 (H) mg/dL   LDL Chol Calc Piccolo Waived 127 (H) <100 mg/dL   VLDL Chol Calc Piccolo,Waive 36 (H) <30 mg/dL  ALT (SGPT) Piccolo, Waived  Result Value Ref Range   ALT (SGPT)  Piccolo, Waived 24 10 - 47 U/L      Assessment & Plan:   Problem List Items Addressed This Visit      Cardiovascular and Mediastinum   Benign hypertension    Controlled today on agents; try weight loss and following DASH guidelines        Endocrine   Impaired fasting glucose    Check glucose and A1c; quit taking metformin on her own; limit simple carbs; work on weight loss      Relevant Orders   Hgb A1c w/o eAG     Musculoskeletal and Integument   Fibromyalgia - Primary    Refer back to rheumatologist; discussed importance of sleep      Relevant Orders   VITAMIN D 25 Hydroxy (Vit-D Deficiency, Fractures)   Ambulatory referral to Rheumatology     Other   Hyperlipidemia    Check lipids today; continue statin; work on weight loss, healthy eating      Relevant Orders   Lipid Panel w/o Chol/HDL Ratio   Medication monitoring encounter    Check sgpt on the statin      Relevant Orders   CBC with Differential/Platelet   Comprehensive metabolic panel   Colon cancer screening   Relevant Orders   Cologuard   Chronic shoulder pain    Refer to rheum first, as she may benefit from injection; no xrays ordered; may need to see ortho next      Relevant Orders   Ambulatory referral to Rheumatology   Insomnia    Likely impacting her fibro; start trazodone      Obesity    Encouraged weight loss; discussed that losing significant weight would help her knees, sugar, BP, lipids, etc.          Follow up plan: Return in about 6 months (around 04/16/2016) for thirty minute follow-up with fasting labs, schedule Medicare visit when due.

## 2015-10-18 NOTE — Assessment & Plan Note (Addendum)
Check lipids today; continue statin; work on weight loss, healthy eating

## 2015-10-18 NOTE — Assessment & Plan Note (Signed)
Refer to rheum first, as she may benefit from injection; no xrays ordered; may need to see ortho next

## 2015-10-19 LAB — COMPREHENSIVE METABOLIC PANEL
ALK PHOS: 59 IU/L (ref 39–117)
ALT: 26 IU/L (ref 0–32)
AST: 26 IU/L (ref 0–40)
Albumin/Globulin Ratio: 1.9 (ref 1.1–2.5)
Albumin: 4.1 g/dL (ref 3.5–4.8)
BILIRUBIN TOTAL: 0.4 mg/dL (ref 0.0–1.2)
BUN/Creatinine Ratio: 17 (ref 11–26)
BUN: 14 mg/dL (ref 8–27)
CHLORIDE: 100 mmol/L (ref 96–106)
CO2: 25 mmol/L (ref 18–29)
Calcium: 9.7 mg/dL (ref 8.7–10.3)
Creatinine, Ser: 0.83 mg/dL (ref 0.57–1.00)
GFR calc Af Amer: 81 mL/min/{1.73_m2} (ref >59)
GFR calc non Af Amer: 71 mL/min/{1.73_m2} (ref >59)
GLUCOSE: 151 mg/dL — AB (ref 65–99)
Globulin, Total: 2.2 g/dL (ref 1.5–4.5)
Potassium: 4.4 mmol/L (ref 3.5–5.2)
Sodium: 140 mmol/L (ref 134–144)
TOTAL PROTEIN: 6.3 g/dL (ref 6.0–8.5)

## 2015-10-19 LAB — LIPID PANEL W/O CHOL/HDL RATIO
Cholesterol, Total: 186 mg/dL (ref 100–199)
HDL: 37 mg/dL — AB (ref >39)
LDL Calculated: 118 mg/dL — ABNORMAL HIGH (ref 0–99)
TRIGLYCERIDES: 153 mg/dL — AB (ref 0–149)
VLDL Cholesterol Cal: 31 mg/dL (ref 5–40)

## 2015-10-19 LAB — CBC WITH DIFFERENTIAL/PLATELET
Basophils Absolute: 0 10*3/uL (ref 0.0–0.2)
Basos: 0 %
EOS (ABSOLUTE): 0.2 10*3/uL (ref 0.0–0.4)
Eos: 2 %
Hematocrit: 41 % (ref 34.0–46.6)
Hemoglobin: 13.5 g/dL (ref 11.1–15.9)
Immature Grans (Abs): 0 10*3/uL (ref 0.0–0.1)
Immature Granulocytes: 0 %
Lymphocytes Absolute: 1.9 10*3/uL (ref 0.7–3.1)
Lymphs: 23 %
MCH: 30.1 pg (ref 26.6–33.0)
MCHC: 32.9 g/dL (ref 31.5–35.7)
MCV: 92 fL (ref 79–97)
Monocytes Absolute: 0.8 10*3/uL (ref 0.1–0.9)
Monocytes: 10 %
Neutrophils Absolute: 5.3 10*3/uL (ref 1.4–7.0)
Neutrophils: 65 %
Platelets: 228 10*3/uL (ref 150–379)
RBC: 4.48 x10E6/uL (ref 3.77–5.28)
RDW: 14 % (ref 12.3–15.4)
WBC: 8.3 10*3/uL (ref 3.4–10.8)

## 2015-10-19 LAB — VITAMIN D 25 HYDROXY (VIT D DEFICIENCY, FRACTURES): VIT D 25 HYDROXY: 20.7 ng/mL — AB (ref 30.0–100.0)

## 2015-10-19 LAB — HGB A1C W/O EAG: Hgb A1c MFr Bld: 6.5 % — ABNORMAL HIGH (ref 4.8–5.6)

## 2015-10-22 ENCOUNTER — Encounter: Payer: Self-pay | Admitting: Family Medicine

## 2015-10-22 DIAGNOSIS — E119 Type 2 diabetes mellitus without complications: Secondary | ICD-10-CM

## 2015-10-22 HISTORY — DX: Type 2 diabetes mellitus without complications: E11.9

## 2015-10-27 ENCOUNTER — Telehealth: Payer: Self-pay | Admitting: Family Medicine

## 2015-10-27 DIAGNOSIS — E119 Type 2 diabetes mellitus without complications: Secondary | ICD-10-CM

## 2015-10-27 NOTE — Telephone Encounter (Signed)
I talked with patient; she says she was diagnosed with diabetes years ago, then got it under better control She is back on her diet; she talked about winter, weight gain, reasons why her sugar went back up She refuses to take metformin I advised that I wanted to see her in the office to discuss her dx, numbers, come up with a plan She says she won't make an appointment; she'll see me in 6 months for her next visit I discussed that she has diabetes, high cholesterol, and I want her to know this is serious, could cause heart attack, stroke She says that she does realize that it is serious; I told her her lipid panel was not at goal; she did not want the statin adjusted; she wants to do this with diet She used to drink beer to flush her kidneys and asked if okay to have three beers a week on the trazodone (that should not be a problem with trazodone) I offered referral to [facility] for diabetic education and she said "I know everything" and declined I again said that I wanted her to be aware of the diagnosis, seriousness, wanted to talk about eye exams, foot exams, new stricter numbers, etc.; she will not come in to see me and says she'll do this herself

## 2015-10-27 NOTE — Telephone Encounter (Signed)
-----   Message from Staci Acosta, [place] sent at 10/24/2015  3:04 PM EST ----- Patient notified, she wants to give it one more good try by herself before starting any meds.

## 2015-10-31 DIAGNOSIS — E559 Vitamin D deficiency, unspecified: Secondary | ICD-10-CM | POA: Diagnosis not present

## 2015-10-31 DIAGNOSIS — M797 Fibromyalgia: Secondary | ICD-10-CM | POA: Diagnosis not present

## 2015-10-31 DIAGNOSIS — M25512 Pain in left shoulder: Secondary | ICD-10-CM | POA: Diagnosis not present

## 2015-10-31 DIAGNOSIS — I10 Essential (primary) hypertension: Secondary | ICD-10-CM | POA: Diagnosis not present

## 2015-10-31 DIAGNOSIS — G8929 Other chronic pain: Secondary | ICD-10-CM | POA: Diagnosis not present

## 2015-10-31 DIAGNOSIS — E785 Hyperlipidemia, unspecified: Secondary | ICD-10-CM | POA: Diagnosis not present

## 2015-10-31 DIAGNOSIS — M7552 Bursitis of left shoulder: Secondary | ICD-10-CM | POA: Diagnosis not present

## 2015-10-31 DIAGNOSIS — Z6841 Body Mass Index (BMI) 40.0 and over, adult: Secondary | ICD-10-CM | POA: Diagnosis not present

## 2015-11-14 DIAGNOSIS — M797 Fibromyalgia: Secondary | ICD-10-CM | POA: Diagnosis not present

## 2015-11-14 DIAGNOSIS — M25612 Stiffness of left shoulder, not elsewhere classified: Secondary | ICD-10-CM | POA: Diagnosis not present

## 2015-11-14 DIAGNOSIS — M25512 Pain in left shoulder: Secondary | ICD-10-CM | POA: Diagnosis not present

## 2015-11-18 DIAGNOSIS — M797 Fibromyalgia: Secondary | ICD-10-CM | POA: Diagnosis not present

## 2015-11-18 DIAGNOSIS — M25512 Pain in left shoulder: Secondary | ICD-10-CM | POA: Diagnosis not present

## 2015-11-18 DIAGNOSIS — M25612 Stiffness of left shoulder, not elsewhere classified: Secondary | ICD-10-CM | POA: Diagnosis not present

## 2015-11-24 ENCOUNTER — Ambulatory Visit: Payer: PPO | Attending: Specialist

## 2015-11-24 DIAGNOSIS — R0683 Snoring: Secondary | ICD-10-CM | POA: Diagnosis not present

## 2015-11-24 DIAGNOSIS — M25512 Pain in left shoulder: Secondary | ICD-10-CM | POA: Diagnosis not present

## 2015-11-24 DIAGNOSIS — M25612 Stiffness of left shoulder, not elsewhere classified: Secondary | ICD-10-CM | POA: Diagnosis not present

## 2015-11-24 DIAGNOSIS — G4761 Periodic limb movement disorder: Secondary | ICD-10-CM | POA: Diagnosis not present

## 2015-11-24 DIAGNOSIS — M797 Fibromyalgia: Secondary | ICD-10-CM | POA: Diagnosis not present

## 2015-11-24 DIAGNOSIS — G4733 Obstructive sleep apnea (adult) (pediatric): Secondary | ICD-10-CM | POA: Diagnosis not present

## 2015-11-24 DIAGNOSIS — E669 Obesity, unspecified: Secondary | ICD-10-CM | POA: Diagnosis not present

## 2015-11-29 DIAGNOSIS — M25612 Stiffness of left shoulder, not elsewhere classified: Secondary | ICD-10-CM | POA: Diagnosis not present

## 2015-11-29 DIAGNOSIS — M25512 Pain in left shoulder: Secondary | ICD-10-CM | POA: Diagnosis not present

## 2015-11-29 DIAGNOSIS — M797 Fibromyalgia: Secondary | ICD-10-CM | POA: Diagnosis not present

## 2015-12-20 DIAGNOSIS — J302 Other seasonal allergic rhinitis: Secondary | ICD-10-CM | POA: Diagnosis not present

## 2015-12-20 DIAGNOSIS — J4520 Mild intermittent asthma, uncomplicated: Secondary | ICD-10-CM | POA: Diagnosis not present

## 2015-12-20 DIAGNOSIS — Z6841 Body Mass Index (BMI) 40.0 and over, adult: Secondary | ICD-10-CM | POA: Diagnosis not present

## 2015-12-20 DIAGNOSIS — G4733 Obstructive sleep apnea (adult) (pediatric): Secondary | ICD-10-CM | POA: Diagnosis not present

## 2015-12-23 ENCOUNTER — Other Ambulatory Visit: Payer: Self-pay | Admitting: Family Medicine

## 2016-01-04 ENCOUNTER — Other Ambulatory Visit: Payer: Self-pay | Admitting: Family Medicine

## 2016-01-04 NOTE — Telephone Encounter (Signed)
Last Cr and -lytes reviewed; Rx approved 

## 2016-01-24 ENCOUNTER — Ambulatory Visit: Payer: PPO | Attending: Specialist

## 2016-01-24 DIAGNOSIS — G4733 Obstructive sleep apnea (adult) (pediatric): Secondary | ICD-10-CM | POA: Insufficient documentation

## 2016-01-24 DIAGNOSIS — G4761 Periodic limb movement disorder: Secondary | ICD-10-CM | POA: Diagnosis not present

## 2016-02-15 DIAGNOSIS — Z6841 Body Mass Index (BMI) 40.0 and over, adult: Secondary | ICD-10-CM | POA: Diagnosis not present

## 2016-02-15 DIAGNOSIS — J310 Chronic rhinitis: Secondary | ICD-10-CM | POA: Diagnosis not present

## 2016-02-15 DIAGNOSIS — G4733 Obstructive sleep apnea (adult) (pediatric): Secondary | ICD-10-CM | POA: Diagnosis not present

## 2016-02-15 DIAGNOSIS — J4520 Mild intermittent asthma, uncomplicated: Secondary | ICD-10-CM | POA: Diagnosis not present

## 2016-03-07 DIAGNOSIS — G4733 Obstructive sleep apnea (adult) (pediatric): Secondary | ICD-10-CM | POA: Diagnosis not present

## 2016-03-16 DIAGNOSIS — G4733 Obstructive sleep apnea (adult) (pediatric): Secondary | ICD-10-CM | POA: Diagnosis not present

## 2016-04-07 DIAGNOSIS — G4733 Obstructive sleep apnea (adult) (pediatric): Secondary | ICD-10-CM | POA: Diagnosis not present

## 2016-04-18 ENCOUNTER — Ambulatory Visit: Payer: PPO | Admitting: Family Medicine

## 2016-04-20 ENCOUNTER — Other Ambulatory Visit: Payer: Self-pay | Admitting: Family Medicine

## 2016-04-20 NOTE — Telephone Encounter (Signed)
Last BP and pulse from Feb reviewed and Rx approved

## 2016-04-25 DIAGNOSIS — R0609 Other forms of dyspnea: Secondary | ICD-10-CM | POA: Diagnosis not present

## 2016-04-25 DIAGNOSIS — J209 Acute bronchitis, unspecified: Secondary | ICD-10-CM | POA: Diagnosis not present

## 2016-04-25 DIAGNOSIS — R05 Cough: Secondary | ICD-10-CM | POA: Diagnosis not present

## 2016-04-25 DIAGNOSIS — J45909 Unspecified asthma, uncomplicated: Secondary | ICD-10-CM | POA: Diagnosis not present

## 2016-04-25 DIAGNOSIS — Z6841 Body Mass Index (BMI) 40.0 and over, adult: Secondary | ICD-10-CM | POA: Diagnosis not present

## 2016-05-08 DIAGNOSIS — G4733 Obstructive sleep apnea (adult) (pediatric): Secondary | ICD-10-CM | POA: Diagnosis not present

## 2016-05-22 ENCOUNTER — Telehealth: Payer: Self-pay | Admitting: Family Medicine

## 2016-05-22 DIAGNOSIS — B353 Tinea pedis: Secondary | ICD-10-CM | POA: Diagnosis not present

## 2016-05-22 DIAGNOSIS — B351 Tinea unguium: Secondary | ICD-10-CM | POA: Diagnosis not present

## 2016-05-22 DIAGNOSIS — E119 Type 2 diabetes mellitus without complications: Secondary | ICD-10-CM | POA: Diagnosis not present

## 2016-05-22 NOTE — Telephone Encounter (Signed)
Patient due for labs and visit please I'll send one month of Rx but she'll need to be seen for further refills please; thank you

## 2016-05-22 NOTE — Telephone Encounter (Signed)
LMOM to inform pt to call and schedule an appt °

## 2016-05-28 DIAGNOSIS — R079 Chest pain, unspecified: Secondary | ICD-10-CM | POA: Diagnosis not present

## 2016-05-28 DIAGNOSIS — M797 Fibromyalgia: Secondary | ICD-10-CM | POA: Diagnosis not present

## 2016-05-28 DIAGNOSIS — Z9989 Dependence on other enabling machines and devices: Secondary | ICD-10-CM | POA: Diagnosis not present

## 2016-05-28 DIAGNOSIS — I10 Essential (primary) hypertension: Secondary | ICD-10-CM | POA: Diagnosis not present

## 2016-05-28 DIAGNOSIS — R5381 Other malaise: Secondary | ICD-10-CM | POA: Diagnosis not present

## 2016-05-28 DIAGNOSIS — R5383 Other fatigue: Secondary | ICD-10-CM | POA: Diagnosis not present

## 2016-05-28 DIAGNOSIS — E785 Hyperlipidemia, unspecified: Secondary | ICD-10-CM | POA: Diagnosis not present

## 2016-05-28 DIAGNOSIS — N390 Urinary tract infection, site not specified: Secondary | ICD-10-CM | POA: Diagnosis not present

## 2016-05-28 DIAGNOSIS — E119 Type 2 diabetes mellitus without complications: Secondary | ICD-10-CM | POA: Diagnosis not present

## 2016-05-28 DIAGNOSIS — G4733 Obstructive sleep apnea (adult) (pediatric): Secondary | ICD-10-CM | POA: Diagnosis not present

## 2016-05-28 DIAGNOSIS — L659 Nonscarring hair loss, unspecified: Secondary | ICD-10-CM | POA: Diagnosis not present

## 2016-05-28 DIAGNOSIS — R002 Palpitations: Secondary | ICD-10-CM | POA: Diagnosis not present

## 2016-05-30 DIAGNOSIS — R079 Chest pain, unspecified: Secondary | ICD-10-CM | POA: Diagnosis not present

## 2016-05-30 DIAGNOSIS — N390 Urinary tract infection, site not specified: Secondary | ICD-10-CM | POA: Diagnosis not present

## 2016-05-30 DIAGNOSIS — R002 Palpitations: Secondary | ICD-10-CM | POA: Diagnosis not present

## 2016-06-07 DIAGNOSIS — G4733 Obstructive sleep apnea (adult) (pediatric): Secondary | ICD-10-CM | POA: Diagnosis not present

## 2016-06-07 DIAGNOSIS — R768 Other specified abnormal immunological findings in serum: Secondary | ICD-10-CM | POA: Diagnosis not present

## 2016-06-07 DIAGNOSIS — M3509 Sicca syndrome with other organ involvement: Secondary | ICD-10-CM | POA: Diagnosis not present

## 2016-06-19 DIAGNOSIS — G4733 Obstructive sleep apnea (adult) (pediatric): Secondary | ICD-10-CM | POA: Diagnosis not present

## 2016-06-20 DIAGNOSIS — R079 Chest pain, unspecified: Secondary | ICD-10-CM | POA: Diagnosis not present

## 2016-06-21 DIAGNOSIS — R682 Dry mouth, unspecified: Secondary | ICD-10-CM | POA: Diagnosis not present

## 2016-06-21 DIAGNOSIS — R079 Chest pain, unspecified: Secondary | ICD-10-CM | POA: Diagnosis not present

## 2016-06-21 DIAGNOSIS — J301 Allergic rhinitis due to pollen: Secondary | ICD-10-CM | POA: Diagnosis not present

## 2016-06-29 DIAGNOSIS — K1123 Chronic sialoadenitis: Secondary | ICD-10-CM | POA: Diagnosis not present

## 2016-06-29 DIAGNOSIS — R682 Dry mouth, unspecified: Secondary | ICD-10-CM | POA: Diagnosis not present

## 2016-07-06 DIAGNOSIS — E119 Type 2 diabetes mellitus without complications: Secondary | ICD-10-CM | POA: Diagnosis not present

## 2016-07-08 DIAGNOSIS — G4733 Obstructive sleep apnea (adult) (pediatric): Secondary | ICD-10-CM | POA: Diagnosis not present

## 2016-07-11 ENCOUNTER — Other Ambulatory Visit: Payer: Self-pay | Admitting: Family Medicine

## 2016-07-11 DIAGNOSIS — Z1231 Encounter for screening mammogram for malignant neoplasm of breast: Secondary | ICD-10-CM

## 2016-07-24 DIAGNOSIS — J310 Chronic rhinitis: Secondary | ICD-10-CM | POA: Diagnosis not present

## 2016-07-24 DIAGNOSIS — G4733 Obstructive sleep apnea (adult) (pediatric): Secondary | ICD-10-CM | POA: Diagnosis not present

## 2016-07-24 DIAGNOSIS — Z6841 Body Mass Index (BMI) 40.0 and over, adult: Secondary | ICD-10-CM | POA: Diagnosis not present

## 2016-07-24 DIAGNOSIS — J4530 Mild persistent asthma, uncomplicated: Secondary | ICD-10-CM | POA: Diagnosis not present

## 2016-08-07 DIAGNOSIS — G4733 Obstructive sleep apnea (adult) (pediatric): Secondary | ICD-10-CM | POA: Diagnosis not present

## 2016-09-04 ENCOUNTER — Ambulatory Visit: Payer: PPO

## 2016-09-07 DIAGNOSIS — G4733 Obstructive sleep apnea (adult) (pediatric): Secondary | ICD-10-CM | POA: Diagnosis not present

## 2016-09-17 DIAGNOSIS — G4733 Obstructive sleep apnea (adult) (pediatric): Secondary | ICD-10-CM | POA: Diagnosis not present

## 2016-09-18 DIAGNOSIS — R6883 Chills (without fever): Secondary | ICD-10-CM | POA: Diagnosis not present

## 2016-09-18 DIAGNOSIS — J019 Acute sinusitis, unspecified: Secondary | ICD-10-CM | POA: Diagnosis not present

## 2016-09-18 DIAGNOSIS — R05 Cough: Secondary | ICD-10-CM | POA: Diagnosis not present

## 2016-10-01 ENCOUNTER — Ambulatory Visit
Admission: RE | Admit: 2016-10-01 | Discharge: 2016-10-01 | Disposition: A | Discharge status: Home / Self Care | Payer: PPO | Source: Ambulatory Visit | Attending: Family Medicine | Admitting: Family Medicine

## 2016-10-01 DIAGNOSIS — Z1231 Encounter for screening mammogram for malignant neoplasm of breast: Secondary | ICD-10-CM | POA: Diagnosis not present

## 2016-10-08 DIAGNOSIS — G4733 Obstructive sleep apnea (adult) (pediatric): Secondary | ICD-10-CM | POA: Diagnosis not present

## 2016-10-31 DIAGNOSIS — Z6841 Body Mass Index (BMI) 40.0 and over, adult: Secondary | ICD-10-CM | POA: Diagnosis not present

## 2016-10-31 DIAGNOSIS — J4531 Mild persistent asthma with (acute) exacerbation: Secondary | ICD-10-CM | POA: Diagnosis not present

## 2016-10-31 DIAGNOSIS — R0609 Other forms of dyspnea: Secondary | ICD-10-CM | POA: Diagnosis not present

## 2016-10-31 DIAGNOSIS — G4733 Obstructive sleep apnea (adult) (pediatric): Secondary | ICD-10-CM | POA: Diagnosis not present

## 2016-11-05 DIAGNOSIS — G4733 Obstructive sleep apnea (adult) (pediatric): Secondary | ICD-10-CM | POA: Diagnosis not present

## 2016-12-06 DIAGNOSIS — G4733 Obstructive sleep apnea (adult) (pediatric): Secondary | ICD-10-CM | POA: Diagnosis not present

## 2017-01-05 DIAGNOSIS — G4733 Obstructive sleep apnea (adult) (pediatric): Secondary | ICD-10-CM | POA: Diagnosis not present

## 2017-01-29 DIAGNOSIS — G4733 Obstructive sleep apnea (adult) (pediatric): Secondary | ICD-10-CM | POA: Diagnosis not present

## 2017-01-31 DIAGNOSIS — G4733 Obstructive sleep apnea (adult) (pediatric): Secondary | ICD-10-CM | POA: Diagnosis not present

## 2017-02-05 DIAGNOSIS — G4733 Obstructive sleep apnea (adult) (pediatric): Secondary | ICD-10-CM | POA: Diagnosis not present

## 2017-03-07 DIAGNOSIS — G4733 Obstructive sleep apnea (adult) (pediatric): Secondary | ICD-10-CM | POA: Diagnosis not present

## 2017-03-19 DIAGNOSIS — J4520 Mild intermittent asthma, uncomplicated: Secondary | ICD-10-CM | POA: Diagnosis not present

## 2017-03-19 DIAGNOSIS — G4733 Obstructive sleep apnea (adult) (pediatric): Secondary | ICD-10-CM | POA: Diagnosis not present

## 2017-03-19 DIAGNOSIS — R0609 Other forms of dyspnea: Secondary | ICD-10-CM | POA: Diagnosis not present

## 2017-03-19 DIAGNOSIS — J310 Chronic rhinitis: Secondary | ICD-10-CM | POA: Diagnosis not present

## 2017-05-03 DIAGNOSIS — G4733 Obstructive sleep apnea (adult) (pediatric): Secondary | ICD-10-CM | POA: Diagnosis not present

## 2017-07-23 ENCOUNTER — Other Ambulatory Visit: Payer: Self-pay | Admitting: Family Medicine

## 2017-07-23 DIAGNOSIS — J310 Chronic rhinitis: Secondary | ICD-10-CM | POA: Diagnosis not present

## 2017-07-23 DIAGNOSIS — R0609 Other forms of dyspnea: Secondary | ICD-10-CM | POA: Diagnosis not present

## 2017-07-23 DIAGNOSIS — J449 Chronic obstructive pulmonary disease, unspecified: Secondary | ICD-10-CM | POA: Diagnosis not present

## 2017-07-23 DIAGNOSIS — G4733 Obstructive sleep apnea (adult) (pediatric): Secondary | ICD-10-CM | POA: Diagnosis not present

## 2017-07-23 NOTE — Telephone Encounter (Signed)
Patient established with another provider Rx denied

## 2017-07-24 DIAGNOSIS — H31002 Unspecified chorioretinal scars, left eye: Secondary | ICD-10-CM | POA: Diagnosis not present

## 2017-07-24 DIAGNOSIS — E119 Type 2 diabetes mellitus without complications: Secondary | ICD-10-CM | POA: Diagnosis not present

## 2017-07-31 DIAGNOSIS — I10 Essential (primary) hypertension: Secondary | ICD-10-CM | POA: Diagnosis not present

## 2017-07-31 DIAGNOSIS — E119 Type 2 diabetes mellitus without complications: Secondary | ICD-10-CM | POA: Diagnosis not present

## 2017-07-31 DIAGNOSIS — E785 Hyperlipidemia, unspecified: Secondary | ICD-10-CM | POA: Diagnosis not present

## 2017-08-07 DIAGNOSIS — E785 Hyperlipidemia, unspecified: Secondary | ICD-10-CM | POA: Diagnosis not present

## 2017-08-07 DIAGNOSIS — Z0000 Encounter for general adult medical examination without abnormal findings: Secondary | ICD-10-CM | POA: Diagnosis not present

## 2017-08-07 DIAGNOSIS — G4733 Obstructive sleep apnea (adult) (pediatric): Secondary | ICD-10-CM | POA: Diagnosis not present

## 2017-08-07 DIAGNOSIS — E119 Type 2 diabetes mellitus without complications: Secondary | ICD-10-CM | POA: Diagnosis not present

## 2017-08-07 DIAGNOSIS — I10 Essential (primary) hypertension: Secondary | ICD-10-CM | POA: Diagnosis not present

## 2017-10-23 DIAGNOSIS — G4733 Obstructive sleep apnea (adult) (pediatric): Secondary | ICD-10-CM | POA: Diagnosis not present

## 2017-10-23 DIAGNOSIS — J4520 Mild intermittent asthma, uncomplicated: Secondary | ICD-10-CM | POA: Diagnosis not present

## 2017-10-23 DIAGNOSIS — J310 Chronic rhinitis: Secondary | ICD-10-CM | POA: Diagnosis not present

## 2017-11-08 DIAGNOSIS — G4733 Obstructive sleep apnea (adult) (pediatric): Secondary | ICD-10-CM | POA: Diagnosis not present

## 2017-11-14 DIAGNOSIS — R768 Other specified abnormal immunological findings in serum: Secondary | ICD-10-CM | POA: Diagnosis not present

## 2017-11-14 DIAGNOSIS — M3509 Sicca syndrome with other organ involvement: Secondary | ICD-10-CM | POA: Diagnosis not present

## 2018-02-19 DIAGNOSIS — G4733 Obstructive sleep apnea (adult) (pediatric): Secondary | ICD-10-CM | POA: Diagnosis not present

## 2018-03-10 DIAGNOSIS — J309 Allergic rhinitis, unspecified: Secondary | ICD-10-CM | POA: Diagnosis not present

## 2018-03-10 DIAGNOSIS — M791 Myalgia, unspecified site: Secondary | ICD-10-CM | POA: Diagnosis not present

## 2018-03-10 DIAGNOSIS — J4541 Moderate persistent asthma with (acute) exacerbation: Secondary | ICD-10-CM | POA: Diagnosis not present

## 2018-03-10 DIAGNOSIS — M797 Fibromyalgia: Secondary | ICD-10-CM | POA: Diagnosis not present

## 2018-04-24 DIAGNOSIS — H9319 Tinnitus, unspecified ear: Secondary | ICD-10-CM | POA: Diagnosis not present

## 2018-04-24 DIAGNOSIS — J301 Allergic rhinitis due to pollen: Secondary | ICD-10-CM | POA: Diagnosis not present

## 2018-05-15 DIAGNOSIS — L409 Psoriasis, unspecified: Secondary | ICD-10-CM | POA: Diagnosis not present

## 2018-05-15 DIAGNOSIS — M3509 Sicca syndrome with other organ involvement: Secondary | ICD-10-CM | POA: Diagnosis not present

## 2018-05-15 DIAGNOSIS — L659 Nonscarring hair loss, unspecified: Secondary | ICD-10-CM | POA: Diagnosis not present

## 2018-05-15 DIAGNOSIS — R768 Other specified abnormal immunological findings in serum: Secondary | ICD-10-CM | POA: Diagnosis not present

## 2018-05-15 DIAGNOSIS — M797 Fibromyalgia: Secondary | ICD-10-CM | POA: Diagnosis not present

## 2018-08-29 DIAGNOSIS — Z8709 Personal history of other diseases of the respiratory system: Secondary | ICD-10-CM | POA: Diagnosis not present

## 2018-08-29 DIAGNOSIS — R05 Cough: Secondary | ICD-10-CM | POA: Diagnosis not present

## 2018-08-29 DIAGNOSIS — J0100 Acute maxillary sinusitis, unspecified: Secondary | ICD-10-CM | POA: Diagnosis not present

## 2018-09-03 DIAGNOSIS — G4733 Obstructive sleep apnea (adult) (pediatric): Secondary | ICD-10-CM | POA: Diagnosis not present

## 2018-09-11 DIAGNOSIS — R0982 Postnasal drip: Secondary | ICD-10-CM | POA: Diagnosis not present

## 2018-09-11 DIAGNOSIS — J029 Acute pharyngitis, unspecified: Secondary | ICD-10-CM | POA: Diagnosis not present

## 2018-11-12 DIAGNOSIS — Z0000 Encounter for general adult medical examination without abnormal findings: Secondary | ICD-10-CM | POA: Diagnosis not present

## 2018-11-18 DIAGNOSIS — J0100 Acute maxillary sinusitis, unspecified: Secondary | ICD-10-CM | POA: Diagnosis not present

## 2018-11-27 DIAGNOSIS — E785 Hyperlipidemia, unspecified: Secondary | ICD-10-CM | POA: Diagnosis not present

## 2018-11-27 DIAGNOSIS — I10 Essential (primary) hypertension: Secondary | ICD-10-CM | POA: Diagnosis not present

## 2018-11-27 DIAGNOSIS — R079 Chest pain, unspecified: Secondary | ICD-10-CM | POA: Diagnosis not present

## 2018-11-27 DIAGNOSIS — G4731 Primary central sleep apnea: Secondary | ICD-10-CM | POA: Diagnosis not present

## 2018-11-27 DIAGNOSIS — M19071 Primary osteoarthritis, right ankle and foot: Secondary | ICD-10-CM | POA: Diagnosis not present

## 2018-11-27 DIAGNOSIS — E119 Type 2 diabetes mellitus without complications: Secondary | ICD-10-CM | POA: Diagnosis not present

## 2018-11-27 DIAGNOSIS — I2510 Atherosclerotic heart disease of native coronary artery without angina pectoris: Secondary | ICD-10-CM | POA: Diagnosis not present

## 2018-11-27 DIAGNOSIS — N2889 Other specified disorders of kidney and ureter: Secondary | ICD-10-CM | POA: Diagnosis not present

## 2018-12-03 DIAGNOSIS — E119 Type 2 diabetes mellitus without complications: Secondary | ICD-10-CM | POA: Diagnosis not present

## 2018-12-03 DIAGNOSIS — I420 Dilated cardiomyopathy: Secondary | ICD-10-CM | POA: Diagnosis not present

## 2018-12-03 DIAGNOSIS — I5043 Acute on chronic combined systolic (congestive) and diastolic (congestive) heart failure: Secondary | ICD-10-CM | POA: Diagnosis not present

## 2018-12-03 DIAGNOSIS — E785 Hyperlipidemia, unspecified: Secondary | ICD-10-CM | POA: Diagnosis not present

## 2018-12-03 DIAGNOSIS — I469 Cardiac arrest, cause unspecified: Secondary | ICD-10-CM | POA: Diagnosis not present

## 2018-12-03 DIAGNOSIS — R000 Tachycardia, unspecified: Secondary | ICD-10-CM | POA: Diagnosis not present

## 2018-12-03 DIAGNOSIS — J690 Pneumonitis due to inhalation of food and vomit: Secondary | ICD-10-CM | POA: Diagnosis not present

## 2018-12-03 DIAGNOSIS — I77810 Thoracic aortic ectasia: Secondary | ICD-10-CM | POA: Diagnosis not present

## 2018-12-03 DIAGNOSIS — Z0000 Encounter for general adult medical examination without abnormal findings: Secondary | ICD-10-CM | POA: Diagnosis not present

## 2018-12-03 DIAGNOSIS — I480 Paroxysmal atrial fibrillation: Secondary | ICD-10-CM | POA: Diagnosis not present

## 2018-12-03 DIAGNOSIS — R140 Abdominal distension (gaseous): Secondary | ICD-10-CM | POA: Diagnosis not present

## 2018-12-03 DIAGNOSIS — J1289 Other viral pneumonia: Secondary | ICD-10-CM | POA: Diagnosis not present

## 2018-12-03 DIAGNOSIS — G9340 Encephalopathy, unspecified: Secondary | ICD-10-CM | POA: Diagnosis not present

## 2018-12-03 DIAGNOSIS — I10 Essential (primary) hypertension: Secondary | ICD-10-CM | POA: Diagnosis not present

## 2018-12-03 DIAGNOSIS — S225XXA Flail chest, initial encounter for closed fracture: Secondary | ICD-10-CM | POA: Diagnosis not present

## 2018-12-10 DIAGNOSIS — R69 Illness, unspecified: Secondary | ICD-10-CM | POA: Diagnosis not present

## 2018-12-10 DIAGNOSIS — R51 Headache: Secondary | ICD-10-CM | POA: Diagnosis not present

## 2018-12-10 DIAGNOSIS — M542 Cervicalgia: Secondary | ICD-10-CM | POA: Diagnosis not present

## 2018-12-10 DIAGNOSIS — M533 Sacrococcygeal disorders, not elsewhere classified: Secondary | ICD-10-CM | POA: Diagnosis not present

## 2018-12-11 DIAGNOSIS — M545 Low back pain: Secondary | ICD-10-CM | POA: Diagnosis not present

## 2018-12-11 DIAGNOSIS — M533 Sacrococcygeal disorders, not elsewhere classified: Secondary | ICD-10-CM | POA: Diagnosis not present

## 2018-12-11 DIAGNOSIS — M542 Cervicalgia: Secondary | ICD-10-CM | POA: Diagnosis not present

## 2018-12-11 DIAGNOSIS — S3992XA Unspecified injury of lower back, initial encounter: Secondary | ICD-10-CM | POA: Diagnosis not present

## 2019-01-02 DIAGNOSIS — B9689 Other specified bacterial agents as the cause of diseases classified elsewhere: Secondary | ICD-10-CM | POA: Diagnosis not present

## 2019-01-02 DIAGNOSIS — Z209 Contact with and (suspected) exposure to unspecified communicable disease: Secondary | ICD-10-CM | POA: Diagnosis not present

## 2019-01-02 DIAGNOSIS — J209 Acute bronchitis, unspecified: Secondary | ICD-10-CM | POA: Diagnosis not present

## 2019-01-02 DIAGNOSIS — R05 Cough: Secondary | ICD-10-CM | POA: Diagnosis not present

## 2019-01-02 DIAGNOSIS — J019 Acute sinusitis, unspecified: Secondary | ICD-10-CM | POA: Diagnosis not present

## 2019-01-02 DIAGNOSIS — R6889 Other general symptoms and signs: Secondary | ICD-10-CM | POA: Diagnosis not present

## 2019-01-02 DIAGNOSIS — J029 Acute pharyngitis, unspecified: Secondary | ICD-10-CM | POA: Diagnosis not present

## 2019-02-24 DIAGNOSIS — H2513 Age-related nuclear cataract, bilateral: Secondary | ICD-10-CM | POA: Diagnosis not present

## 2019-06-01 DIAGNOSIS — M3509 Sicca syndrome with other organ involvement: Secondary | ICD-10-CM | POA: Diagnosis not present

## 2019-06-03 DIAGNOSIS — E538 Deficiency of other specified B group vitamins: Secondary | ICD-10-CM | POA: Diagnosis not present

## 2019-06-03 DIAGNOSIS — M3509 Sicca syndrome with other organ involvement: Secondary | ICD-10-CM | POA: Diagnosis not present

## 2019-06-03 DIAGNOSIS — Z13820 Encounter for screening for osteoporosis: Secondary | ICD-10-CM | POA: Diagnosis not present

## 2019-06-19 DIAGNOSIS — I872 Venous insufficiency (chronic) (peripheral): Secondary | ICD-10-CM | POA: Diagnosis not present

## 2019-06-19 DIAGNOSIS — I10 Essential (primary) hypertension: Secondary | ICD-10-CM | POA: Diagnosis not present

## 2019-07-09 DIAGNOSIS — M3509 Sicca syndrome with other organ involvement: Secondary | ICD-10-CM | POA: Diagnosis not present

## 2019-07-09 DIAGNOSIS — R768 Other specified abnormal immunological findings in serum: Secondary | ICD-10-CM | POA: Diagnosis not present

## 2019-07-09 DIAGNOSIS — I890 Lymphedema, not elsewhere classified: Secondary | ICD-10-CM | POA: Diagnosis not present

## 2019-07-09 DIAGNOSIS — Z6841 Body Mass Index (BMI) 40.0 and over, adult: Secondary | ICD-10-CM | POA: Diagnosis not present

## 2019-07-09 DIAGNOSIS — R21 Rash and other nonspecific skin eruption: Secondary | ICD-10-CM | POA: Diagnosis not present

## 2019-07-16 DIAGNOSIS — E785 Hyperlipidemia, unspecified: Secondary | ICD-10-CM | POA: Diagnosis not present

## 2019-07-16 DIAGNOSIS — I890 Lymphedema, not elsewhere classified: Secondary | ICD-10-CM | POA: Diagnosis not present

## 2019-07-16 DIAGNOSIS — E119 Type 2 diabetes mellitus without complications: Secondary | ICD-10-CM | POA: Diagnosis not present

## 2019-07-16 DIAGNOSIS — I10 Essential (primary) hypertension: Secondary | ICD-10-CM | POA: Diagnosis not present

## 2019-07-16 DIAGNOSIS — R413 Other amnesia: Secondary | ICD-10-CM | POA: Diagnosis not present

## 2020-01-13 DIAGNOSIS — Z0000 Encounter for general adult medical examination without abnormal findings: Secondary | ICD-10-CM | POA: Diagnosis not present

## 2020-03-22 DIAGNOSIS — M25561 Pain in right knee: Secondary | ICD-10-CM | POA: Diagnosis not present

## 2020-03-22 DIAGNOSIS — M25562 Pain in left knee: Secondary | ICD-10-CM | POA: Diagnosis not present

## 2020-03-23 DIAGNOSIS — G4733 Obstructive sleep apnea (adult) (pediatric): Secondary | ICD-10-CM | POA: Diagnosis not present

## 2020-04-19 DIAGNOSIS — I10 Essential (primary) hypertension: Secondary | ICD-10-CM | POA: Diagnosis not present

## 2020-04-19 DIAGNOSIS — E119 Type 2 diabetes mellitus without complications: Secondary | ICD-10-CM | POA: Diagnosis not present

## 2020-04-19 DIAGNOSIS — E785 Hyperlipidemia, unspecified: Secondary | ICD-10-CM | POA: Diagnosis not present

## 2020-04-19 DIAGNOSIS — R519 Headache, unspecified: Secondary | ICD-10-CM | POA: Diagnosis not present

## 2020-04-19 DIAGNOSIS — R413 Other amnesia: Secondary | ICD-10-CM | POA: Diagnosis not present

## 2020-05-31 DIAGNOSIS — E119 Type 2 diabetes mellitus without complications: Secondary | ICD-10-CM | POA: Diagnosis not present

## 2020-05-31 DIAGNOSIS — I10 Essential (primary) hypertension: Secondary | ICD-10-CM | POA: Diagnosis not present

## 2020-05-31 DIAGNOSIS — Z0000 Encounter for general adult medical examination without abnormal findings: Secondary | ICD-10-CM | POA: Diagnosis not present

## 2020-05-31 DIAGNOSIS — Z1211 Encounter for screening for malignant neoplasm of colon: Secondary | ICD-10-CM | POA: Diagnosis not present

## 2020-05-31 DIAGNOSIS — E785 Hyperlipidemia, unspecified: Secondary | ICD-10-CM | POA: Diagnosis not present

## 2020-06-08 ENCOUNTER — Other Ambulatory Visit (HOSPITAL_COMMUNITY): Payer: Self-pay | Admitting: Neurology

## 2020-06-08 DIAGNOSIS — Z6841 Body Mass Index (BMI) 40.0 and over, adult: Secondary | ICD-10-CM | POA: Diagnosis not present

## 2020-06-08 DIAGNOSIS — E538 Deficiency of other specified B group vitamins: Secondary | ICD-10-CM | POA: Diagnosis not present

## 2020-06-08 DIAGNOSIS — R4189 Other symptoms and signs involving cognitive functions and awareness: Secondary | ICD-10-CM | POA: Diagnosis not present

## 2020-06-08 DIAGNOSIS — G4733 Obstructive sleep apnea (adult) (pediatric): Secondary | ICD-10-CM | POA: Diagnosis not present

## 2020-06-08 DIAGNOSIS — Z9989 Dependence on other enabling machines and devices: Secondary | ICD-10-CM | POA: Diagnosis not present

## 2020-06-08 DIAGNOSIS — Z8669 Personal history of other diseases of the nervous system and sense organs: Secondary | ICD-10-CM | POA: Diagnosis not present

## 2020-06-08 DIAGNOSIS — E519 Thiamine deficiency, unspecified: Secondary | ICD-10-CM | POA: Diagnosis not present

## 2020-06-18 ENCOUNTER — Other Ambulatory Visit: Payer: Self-pay

## 2020-06-18 ENCOUNTER — Ambulatory Visit (HOSPITAL_COMMUNITY)
Admission: RE | Admit: 2020-06-18 | Discharge: 2020-06-18 | Disposition: A | Discharge status: Home / Self Care | Payer: PPO | Source: Ambulatory Visit | Attending: Neurology | Admitting: Neurology

## 2020-06-18 DIAGNOSIS — R930 Abnormal findings on diagnostic imaging of skull and head, not elsewhere classified: Secondary | ICD-10-CM | POA: Diagnosis not present

## 2020-06-18 DIAGNOSIS — R4189 Other symptoms and signs involving cognitive functions and awareness: Secondary | ICD-10-CM | POA: Diagnosis not present

## 2020-06-18 DIAGNOSIS — G3184 Mild cognitive impairment, so stated: Secondary | ICD-10-CM | POA: Diagnosis not present

## 2020-12-21 DIAGNOSIS — J4521 Mild intermittent asthma with (acute) exacerbation: Secondary | ICD-10-CM | POA: Diagnosis not present

## 2020-12-21 DIAGNOSIS — I10 Essential (primary) hypertension: Secondary | ICD-10-CM | POA: Diagnosis not present

## 2020-12-30 DIAGNOSIS — G4733 Obstructive sleep apnea (adult) (pediatric): Secondary | ICD-10-CM | POA: Diagnosis not present

## 2021-02-15 DIAGNOSIS — R059 Cough, unspecified: Secondary | ICD-10-CM | POA: Diagnosis not present

## 2021-02-15 DIAGNOSIS — R509 Fever, unspecified: Secondary | ICD-10-CM | POA: Diagnosis not present

## 2021-02-23 DIAGNOSIS — M6289 Other specified disorders of muscle: Secondary | ICD-10-CM | POA: Diagnosis not present

## 2021-02-23 DIAGNOSIS — K625 Hemorrhage of anus and rectum: Secondary | ICD-10-CM | POA: Diagnosis not present

## 2021-02-23 DIAGNOSIS — K5900 Constipation, unspecified: Secondary | ICD-10-CM | POA: Diagnosis not present

## 2021-03-03 DIAGNOSIS — K121 Other forms of stomatitis: Secondary | ICD-10-CM | POA: Diagnosis not present

## 2021-03-03 DIAGNOSIS — J301 Allergic rhinitis due to pollen: Secondary | ICD-10-CM | POA: Diagnosis not present

## 2021-03-03 DIAGNOSIS — J019 Acute sinusitis, unspecified: Secondary | ICD-10-CM | POA: Diagnosis not present

## 2021-03-09 DIAGNOSIS — E119 Type 2 diabetes mellitus without complications: Secondary | ICD-10-CM | POA: Diagnosis not present

## 2021-03-14 DIAGNOSIS — K625 Hemorrhage of anus and rectum: Secondary | ICD-10-CM | POA: Diagnosis not present

## 2021-03-28 ENCOUNTER — Encounter: Payer: Self-pay | Admitting: General Surgery

## 2021-03-29 ENCOUNTER — Ambulatory Visit
Admission: RE | Admit: 2021-03-29 | Discharge: 2021-03-29 | Disposition: A | Discharge status: Home / Self Care | Payer: PPO | Attending: General Surgery | Admitting: General Surgery

## 2021-03-29 ENCOUNTER — Ambulatory Visit: Payer: PPO | Admitting: Anesthesiology

## 2021-03-29 ENCOUNTER — Encounter
Admission: RE | Disposition: A | Discharge status: Home / Self Care | Payer: Self-pay | Source: Home / Self Care | Attending: General Surgery

## 2021-03-29 DIAGNOSIS — D122 Benign neoplasm of ascending colon: Secondary | ICD-10-CM | POA: Diagnosis not present

## 2021-03-29 DIAGNOSIS — K5730 Diverticulosis of large intestine without perforation or abscess without bleeding: Secondary | ICD-10-CM | POA: Diagnosis not present

## 2021-03-29 DIAGNOSIS — K621 Rectal polyp: Secondary | ICD-10-CM | POA: Diagnosis not present

## 2021-03-29 DIAGNOSIS — K6389 Other specified diseases of intestine: Secondary | ICD-10-CM | POA: Diagnosis not present

## 2021-03-29 DIAGNOSIS — Z7982 Long term (current) use of aspirin: Secondary | ICD-10-CM | POA: Insufficient documentation

## 2021-03-29 DIAGNOSIS — K625 Hemorrhage of anus and rectum: Secondary | ICD-10-CM | POA: Insufficient documentation

## 2021-03-29 DIAGNOSIS — D123 Benign neoplasm of transverse colon: Secondary | ICD-10-CM | POA: Insufficient documentation

## 2021-03-29 DIAGNOSIS — Z87891 Personal history of nicotine dependence: Secondary | ICD-10-CM | POA: Insufficient documentation

## 2021-03-29 DIAGNOSIS — K5790 Diverticulosis of intestine, part unspecified, without perforation or abscess without bleeding: Secondary | ICD-10-CM | POA: Diagnosis not present

## 2021-03-29 DIAGNOSIS — E119 Type 2 diabetes mellitus without complications: Secondary | ICD-10-CM | POA: Insufficient documentation

## 2021-03-29 DIAGNOSIS — D128 Benign neoplasm of rectum: Secondary | ICD-10-CM | POA: Diagnosis not present

## 2021-03-29 DIAGNOSIS — K635 Polyp of colon: Secondary | ICD-10-CM | POA: Diagnosis not present

## 2021-03-29 DIAGNOSIS — Z79899 Other long term (current) drug therapy: Secondary | ICD-10-CM | POA: Diagnosis not present

## 2021-03-29 DIAGNOSIS — E785 Hyperlipidemia, unspecified: Secondary | ICD-10-CM | POA: Diagnosis not present

## 2021-03-29 HISTORY — PX: COLONOSCOPY WITH PROPOFOL: SHX5780

## 2021-03-29 HISTORY — DX: Gastro-esophageal reflux disease without esophagitis: K21.9

## 2021-03-29 HISTORY — DX: Sleep apnea, unspecified: G47.30

## 2021-03-29 SURGERY — COLONOSCOPY WITH PROPOFOL
Anesthesia: General

## 2021-03-29 MED ORDER — PHENYLEPHRINE HCL (PRESSORS) 10 MG/ML IV SOLN
INTRAVENOUS | Status: DC | PRN
  Administered 2021-03-29: 50 ug via INTRAVENOUS

## 2021-03-29 MED ORDER — LIDOCAINE HCL (CARDIAC) PF 100 MG/5ML IV SOSY
PREFILLED_SYRINGE | INTRAVENOUS | Status: DC | PRN
  Administered 2021-03-29: 40 mg via INTRAVENOUS

## 2021-03-29 MED ORDER — PROPOFOL 10 MG/ML IV BOLUS
INTRAVENOUS | Status: DC | PRN
  Administered 2021-03-29: 40 mg via INTRAVENOUS

## 2021-03-29 MED ORDER — PROPOFOL 500 MG/50ML IV EMUL
INTRAVENOUS | Status: DC | PRN
  Administered 2021-03-29: 100 ug/kg/min via INTRAVENOUS

## 2021-03-29 MED ORDER — SODIUM CHLORIDE 0.9 % IV SOLN
INTRAVENOUS | Status: DC
  Administered 2021-03-29: 1000 mL via INTRAVENOUS

## 2021-03-29 NOTE — Op Note (Signed)
[facility] Patient Name: Carrie Wong Procedure Date: 03/29/2021 10:11 AM MRN: [id number] Account #: [id number] Date of Birth: 1941-08-27 Admit Type: Outpatient Age: 80 Room: [facility] ENDO ROOM 1 Gender: Female Note Status: Finalized Procedure:             Colonoscopy Indications:           Anal bleeding Providers:             Robert Bellow, MD Referring MD:          Irven Easterly. Kary Kos, MD (Referring MD) Medicines:             Propofol per Anesthesia Complications:         No immediate complications. Procedure:             Pre-Anesthesia Assessment:                        - Prior to the procedure, a History and Physical was                         performed, and patient medications, allergies and                         sensitivities were reviewed. The patient's tolerance                         of previous anesthesia was reviewed.                        - The risks and benefits of the procedure and the                         sedation options and risks were discussed with the                         patient. All questions were answered and informed                         consent was obtained.                        After obtaining informed consent, the colonoscope was                         passed under direct vision. Throughout the procedure,                         the patient's blood pressure, pulse, and oxygen                         saturations were monitored continuously. The                         Colonoscope was introduced through the anus and                         advanced to the the cecum, identified by appendiceal                         orifice and ileocecal valve. The  colonoscopy was                         performed without difficulty. The patient tolerated                         the procedure well. The quality of the bowel                         preparation was excellent. Findings:      Four sessile polyps were found in the  rectum, proximal rectum, distal       rectum, transverse colon, proximal transverse colon, ascending colon and       distal ascending colon. The polyps were 5 mm in size. These were       biopsied with a cold large-capacity forceps for histology.      A few medium-mouthed diverticula were found in the recto-sigmoid colon       and sigmoid colon.      The retroflexed view of the distal rectum and anal verge was normal and       showed no anal or rectal abnormalities. Impression:            - Four 5 mm polyps in the rectum, in the proximal                         rectum, in the distal rectum, in the transverse colon,                         in the proximal transverse colon, in the ascending                         colon and in the distal ascending colon. Biopsied.                        - Diverticulosis in the recto-sigmoid colon and in the                         sigmoid colon.                        - The distal rectum and anal verge are normal on                         retroflexion view. Recommendation:        - Telephone endoscopist for pathology results in 1                         week. Procedure Code(s):     --- Professional ---                        [phone], Colonoscopy, flexible; with biopsy, single or                         multiple Diagnosis Code(s):     --- Professional ---                        K62.1, Rectal polyp  K63.5, Polyp of colon                        K62.5, Hemorrhage of anus and rectum                        K57.30, Diverticulosis of large intestine without                         perforation or abscess without bleeding CPT copyright 2019 American Medical Association. All rights reserved. The codes documented in this report are preliminary and upon coder review may  be revised to meet current compliance requirements. Robert Bellow, MD 03/29/2021 10:45:08 AM This report has been signed electronically. Number of Addenda: 0 Note Initiated  On: 03/29/2021 10:11 AM Scope Withdrawal Time: 0 hours 11 minutes 13 seconds  Total Procedure Duration: 0 hours 21 minutes 24 seconds  Estimated Blood Loss:  Estimated blood loss: none.      [facility]

## 2021-03-29 NOTE — H&P (Signed)
Carrie Wong [id number] March 21, 1941     HPI:  Healthy 80 y/o woman with some intermittent rectal bleeding, different in color/consistency than she had experienced in the past with hemorrhoids. For colonoscopy. She tolerated the prep well.    Medications Prior to Admission  Medication Sig Dispense Refill Last Dose   aspirin EC 81 MG tablet Take 81 mg by mouth daily.   Past Week   Azelastine-Fluticasone 137-50 MCG/ACT SUSP Place 1 spray into the nose 2 (two) times daily. 3 Bottle 3 03/28/2021   benzonatate (TESSALON) 100 MG capsule Take 200 mg by mouth 3 (three) times daily as needed for cough.    at prn   Cinnamon Bark POWD 1 Dose by Does not apply route daily.   Past Week   clotrimazole-betamethasone (LOTRISONE) cream Apply topically.   Past Week   diphenhydrAMINE (BENADRYL) 25 mg capsule Take 25 mg by mouth daily.    at prn   fluticasone (FLONASE) 50 MCG/ACT nasal spray Place 2 sprays into both nostrils daily.    at prn   GLUCOSAMINE CHONDROITIN COMPLX PO Take 1 Dose by mouth daily.   Past Week   ketoconazole (NIZORAL) 2 % cream Apply 1 application topically daily.   Past Week   metoprolol succinate (TOPROL-XL) 50 MG 24 hr tablet TAKE 1 TABLET MOUTH ONCE DAILY WITH A MEAL 90 tablet 1 03/28/2021   montelukast (SINGULAIR) 10 MG tablet Take 1 tablet (10 mg total) by mouth at bedtime. (Patient taking differently: Take 10 mg by mouth daily.) 90 tablet 3 03/28/2021   pilocarpine (SALAGEN) 5 MG tablet Take 5 mg by mouth 3 (three) times daily.   03/29/2021 at 0630   simvastatin (ZOCOR) 10 MG tablet TAKE ONE TABLET BY MOUTH EVERY NIGHT AT BEDTIME 30 tablet 0 03/28/2021   telmisartan (MICARDIS) 80 MG tablet Take 80 mg by mouth daily.   03/28/2021   traMADol (ULTRAM) 50 MG tablet Take by mouth every 6 (six) hours as needed.   Past Week   traZODone (DESYREL) 50 MG tablet TAKE 1/2 TO 1 TABLET BY MOUTH AT BEDTIMEAS NEEDED FOR SLEEP 90 tablet 1 Past Week   vitamin B-12 (CYANOCOBALAMIN) 1000 MCG tablet Take 1,000 mcg  by mouth daily.   03/28/2021   Omega 3-6-9 Fatty Acids (OMEGA 3-6-9 COMPLEX PO) Take 1 capsule by mouth daily. (Patient not taking: Reported on 03/29/2021)   Not Taking   Turmeric 500 MG CAPS Take 1 capsule by mouth daily. (Patient not taking: Reported on 03/29/2021)   Not Taking   Allergies  Allergen Reactions   Amlodipine Besylate Swelling   Past Medical History:  Diagnosis Date   Allergy    Arthritis of knee, left    Asthma    Diabetes mellitus without complication (HCC)    Fibromyalgia    Fibromyalgia    GERD (gastroesophageal reflux disease)    Hip arthritis    Hyperlipidemia    Hypertension    Obesity    Plantar fasciitis    Sleep apnea    Tinea    Type 2 diabetes mellitus ([place]) 10/22/2015   Past Surgical History:  Procedure Laterality Date   ABDOMINAL HYSTERECTOMY  01/26/2012   Total vaginal for precancerous cells (ovaries remain)   BILATERAL CARPAL TUNNEL RELEASE Bilateral    DILATION AND CURETTAGE OF UTERUS     HALLUX VALGUS CORRECTION Left    Social History   Socioeconomic History   Marital status: Married    Spouse name: Not on file  Number of children: Not on file   Years of education: Not on file   Highest education level: Not on file  Occupational History   Not on file  Tobacco Use   Smoking status: Former    Packs/day: 0.50    Years: 10.00    Pack years: 5.00    Types: Cigarettes    Quit date: 12/26/1966    Years since quitting: 54.2   Smokeless tobacco: Never  Vaping Use   Vaping Use: Never used  Substance and Sexual Activity   Alcohol use: No   Drug use: No   Sexual activity: Not on file  Other Topics Concern   Not on file  Social History Narrative   Not on file   Social Determinants of Health   Financial Resource Strain: Not on file  Food Insecurity: Not on file  Transportation Needs: Not on file  Physical Activity: Not on file  Stress: Not on file  Social Connections: Not on file  Intimate Partner Violence: Not on file   Social  History   Social History Narrative   Not on file     ROS: Negative.     PE: HEENT: Negative. Lungs: Clear. Cardio: RR.   Assessment/Plan:  Proceed with planned endoscopy.   Carrie Wong Carrie Wong 03/29/2021

## 2021-03-29 NOTE — Transfer of Care (Signed)
Immediate Anesthesia Transfer of Care Note  Patient: Carrie Wong  Procedure(s) Performed: COLONOSCOPY WITH PROPOFOL  Patient Location: PACU and Endoscopy Unit  Anesthesia Type:General  Level of Consciousness: awake  Airway & Oxygen Therapy: Patient Spontanous Breathing  Post-op Assessment: Report given to RN and Post -op Vital signs reviewed and stable  Post vital signs: Reviewed and stable  Last Vitals:  Vitals Value Taken Time  BP 115/61 03/29/21 1046  Temp    Pulse 81 03/29/21 1046  Resp 17 03/29/21 1046  SpO2 100 % 03/29/21 1046  Vitals shown include unvalidated device data.  Last Pain:  Vitals:   03/29/21 0925  TempSrc: Temporal  PainSc: 0-No pain         Complications: No notable events documented.

## 2021-03-29 NOTE — Anesthesia Preprocedure Evaluation (Signed)
Anesthesia Evaluation  Patient identified by MRN, date of birth, ID band Patient awake    Reviewed: Allergy & Precautions, H&P , NPO status , Patient's Chart, lab work & pertinent test results, reviewed documented beta blocker date and time   Airway Mallampati: II   Neck ROM: full    Dental  (+) Poor Dentition   Pulmonary asthma , sleep apnea , former smoker,    Pulmonary exam normal        Cardiovascular Exercise Tolerance: Poor hypertension, On Medications negative cardio ROS Normal cardiovascular exam Rhythm:regular Rate:Normal     Neuro/Psych  Neuromuscular disease negative psych ROS   GI/Hepatic Neg liver ROS, GERD  Medicated,  Endo/Other  negative endocrine ROSdiabetes, Well Controlled, Type 2  Renal/GU negative Renal ROS  negative genitourinary   Musculoskeletal   Abdominal   Peds  Hematology negative hematology ROS (+)   Anesthesia Other Findings Past Medical History: No date: Allergy No date: Arthritis of knee, left No date: Asthma No date: Diabetes mellitus without complication (HCC) No date: Fibromyalgia No date: Fibromyalgia No date: GERD (gastroesophageal reflux disease) No date: Hip arthritis No date: Hyperlipidemia No date: Hypertension No date: Obesity No date: Plantar fasciitis No date: Sleep apnea No date: Tinea 10/22/2015: Type 2 diabetes mellitus ([place]) Past Surgical History: 01/26/2012: ABDOMINAL HYSTERECTOMY     Comment:  Total vaginal for precancerous cells (ovaries remain) No date: BILATERAL CARPAL TUNNEL RELEASE; Bilateral No date: DILATION AND CURETTAGE OF UTERUS No date: [place]; Left BMI    Body Mass Index: 37.67 kg/m     Reproductive/Obstetrics negative OB ROS                             Anesthesia Physical Anesthesia Plan  ASA: 3  Anesthesia Plan: General   Post-op Pain Management:    Induction:   PONV Risk Score and  Plan:   Airway Management Planned:   Additional Equipment:   Intra-op Plan:   Post-operative Plan:   Informed Consent: I have reviewed the patients History and Physical, chart, labs and discussed the procedure including the risks, benefits and alternatives for the proposed anesthesia with the patient or authorized representative who has indicated his/her understanding and acceptance.     Dental Advisory Given  Plan Discussed with: CRNA  Anesthesia Plan Comments:         Anesthesia Quick Evaluation

## 2021-03-30 ENCOUNTER — Encounter: Payer: Self-pay | Admitting: General Surgery

## 2021-03-30 LAB — SURGICAL PATHOLOGY

## 2021-03-31 DIAGNOSIS — B353 Tinea pedis: Secondary | ICD-10-CM | POA: Diagnosis not present

## 2021-04-04 NOTE — Anesthesia Postprocedure Evaluation (Signed)
Anesthesia Post Note  Patient: Carrie Wong  Procedure(s) Performed: COLONOSCOPY WITH PROPOFOL  Patient location during evaluation: PACU Anesthesia Type: General Level of consciousness: awake and alert Pain management: pain level controlled Vital Signs Assessment: post-procedure vital signs reviewed and stable Respiratory status: spontaneous breathing, nonlabored ventilation, respiratory function stable and patient connected to nasal cannula oxygen Cardiovascular status: blood pressure returned to baseline and stable Postop Assessment: no apparent nausea or vomiting Anesthetic complications: no   No notable events documented.   Last Vitals:  Vitals:   03/29/21 1047 03/29/21 1050  BP: 115/61 128/70  Pulse: 81 (!) 58  Resp: 17 18  Temp:    SpO2: 100% 94%    Last Pain:  Vitals:   03/29/21 1040  TempSrc: Temporal  PainSc:                  Molli Barrows

## 2021-04-13 ENCOUNTER — Emergency Department: Payer: PPO

## 2021-04-13 ENCOUNTER — Other Ambulatory Visit: Payer: Self-pay

## 2021-04-13 ENCOUNTER — Emergency Department
Admission: EM | Admit: 2021-04-13 | Discharge: 2021-04-13 | Disposition: A | Discharge status: Home / Self Care | Payer: PPO | Attending: Emergency Medicine | Admitting: Emergency Medicine

## 2021-04-13 DIAGNOSIS — R600 Localized edema: Secondary | ICD-10-CM | POA: Diagnosis not present

## 2021-04-13 DIAGNOSIS — E119 Type 2 diabetes mellitus without complications: Secondary | ICD-10-CM | POA: Insufficient documentation

## 2021-04-13 DIAGNOSIS — Z79899 Other long term (current) drug therapy: Secondary | ICD-10-CM | POA: Insufficient documentation

## 2021-04-13 DIAGNOSIS — Z87891 Personal history of nicotine dependence: Secondary | ICD-10-CM | POA: Diagnosis not present

## 2021-04-13 DIAGNOSIS — I10 Essential (primary) hypertension: Secondary | ICD-10-CM | POA: Diagnosis not present

## 2021-04-13 DIAGNOSIS — R5383 Other fatigue: Secondary | ICD-10-CM | POA: Diagnosis not present

## 2021-04-13 DIAGNOSIS — M79604 Pain in right leg: Secondary | ICD-10-CM | POA: Diagnosis not present

## 2021-04-13 DIAGNOSIS — M7989 Other specified soft tissue disorders: Secondary | ICD-10-CM | POA: Insufficient documentation

## 2021-04-13 DIAGNOSIS — J45909 Unspecified asthma, uncomplicated: Secondary | ICD-10-CM | POA: Insufficient documentation

## 2021-04-13 DIAGNOSIS — Z7982 Long term (current) use of aspirin: Secondary | ICD-10-CM | POA: Insufficient documentation

## 2021-04-13 DIAGNOSIS — M79605 Pain in left leg: Secondary | ICD-10-CM | POA: Diagnosis not present

## 2021-04-13 LAB — COMPREHENSIVE METABOLIC PANEL
ALT: 17 U/L (ref 0–44)
AST: 21 U/L (ref 15–41)
Albumin: 4.4 g/dL (ref 3.5–5.0)
Alkaline Phosphatase: 52 U/L (ref 38–126)
Anion gap: 6 (ref 5–15)
BUN: 11 mg/dL (ref 8–23)
CO2: 29 mmol/L (ref 22–32)
Calcium: 9.4 mg/dL (ref 8.9–10.3)
Chloride: 104 mmol/L (ref 98–111)
Creatinine, Ser: 0.68 mg/dL (ref 0.44–1.00)
GFR, Estimated: >60 mL/min (ref >60)
Glucose, Bld: 160 mg/dL — ABNORMAL HIGH (ref 70–99)
Potassium: 4.2 mmol/L (ref 3.5–5.1)
Sodium: 139 mmol/L (ref 135–145)
Total Bilirubin: 0.8 mg/dL (ref 0.3–1.2)
Total Protein: 7 g/dL (ref 6.5–8.1)

## 2021-04-13 LAB — TSH: TSH: 1.924 u[IU]/mL (ref 0.350–4.500)

## 2021-04-13 LAB — CBC
HCT: 42 % (ref 36.0–46.0)
Hemoglobin: 13.9 g/dL (ref 12.0–15.0)
MCH: 30.4 pg (ref 26.0–34.0)
MCHC: 33.1 g/dL (ref 30.0–36.0)
MCV: 91.9 fL (ref 80.0–100.0)
Platelets: 179 10*3/uL (ref 150–400)
RBC: 4.57 MIL/uL (ref 3.87–5.11)
RDW: 12.9 % (ref 11.5–15.5)
WBC: 5.7 10*3/uL (ref 4.0–10.5)
nRBC: 0 % (ref 0.0–0.2)

## 2021-04-13 LAB — BRAIN NATRIURETIC PEPTIDE: B Natriuretic Peptide: 96.1 pg/mL (ref 0.0–100.0)

## 2021-04-13 MED ORDER — DOXYCYCLINE HYCLATE 100 MG PO CAPS: 100.0000 mg | ORAL_CAPSULE | Freq: Two times a day (BID) | ORAL | 0 refills | Status: AC

## 2021-04-13 MED ORDER — FUROSEMIDE 10 MG/ML IJ SOLN
40.0000 mg | Freq: Once | INTRAMUSCULAR | Status: AC
  Administered 2021-04-13: 40 mg via INTRAVENOUS
  Filled 2021-04-13: qty 4

## 2021-04-13 MED ORDER — FUROSEMIDE 20 MG PO TABS: 20.0000 mg | ORAL_TABLET | Freq: Every day | ORAL | 0 refills | Status: DC

## 2021-04-13 MED ORDER — POTASSIUM CHLORIDE CRYS ER 20 MEQ PO TBCR: 20.0000 meq | EXTENDED_RELEASE_TABLET | Freq: Every day | ORAL | 0 refills | Status: DC

## 2021-04-13 NOTE — ED Notes (Signed)
Patient transported to X-ray 

## 2021-04-13 NOTE — ED Provider Notes (Signed)
  Patient received in signout from Dr. Ellender Hose.  Bilateral lower extremity DVT ultrasounds are negative.  We will discharge with short prescription of Lasix and close outpatient follow-up, per original plan of care.   [name], MD 04/13/21 [phone]

## 2021-04-13 NOTE — ED Triage Notes (Addendum)
Pt reports having swelling for a while, wanted a fluid pill from her dr who wouldn't prescribe to her, states that this has been going on for at least 4 mos and pt states the swelling on her knees make it hard for he to get around, denies hx of chf, pt reports that at one point in her life her dr thought she may have kidney failure, states some sob

## 2021-04-13 NOTE — ED Provider Notes (Signed)
[facility] Emergency Department Provider Note  ____________________________________________   Event Date/Time   First MD Initiated Contact with Patient 04/13/21 1259     (approximate)  I have reviewed the triage vital signs and the nursing notes.   HISTORY  Chief Complaint Leg Swelling    HPI Carrie Wong is a 80 y.o. female here with bilateral leg swelling.  The patient states that over the last several weeks, she has had progressively worsening bilateral lower extremity swelling.  It is slightly worse in her left greater than right legs.  The swelling has been constant and worsening.  She feels like she has gained about 10 pounds based on her weights.  She has had no associated shortness of breath.  No orthopnea.  No history of CHF.  No history of renal failure.  She was seen by her PCP and they thought it was related to prednisone use, which she has been on recently for recurrent cough, but was not given anything.  Denies history of renal or liver failure.  No other recent medication changes.  Symptoms do not necessarily even improve with elevation or rest.  No specific alleviating factors.    Past Medical History:  Diagnosis Date   Allergy    Arthritis of knee, left    Asthma    Diabetes mellitus without complication (HCC)    Fibromyalgia    Fibromyalgia    GERD (gastroesophageal reflux disease)    Hip arthritis    Hyperlipidemia    Hypertension    Obesity    Plantar fasciitis    Sleep apnea    Tinea    Type 2 diabetes mellitus ([place]) 10/22/2015    Patient Active Problem List   Diagnosis Date Noted   Type 2 diabetes mellitus ([place]) 10/22/2015   Colon cancer screening 10/18/2015   Chronic shoulder pain 10/18/2015   Insomnia 10/18/2015   Obesity 10/18/2015   Medication monitoring encounter 04/14/2015   Breast cancer screening 04/14/2015   Urinary, incontinence, stress female 04/14/2015   Asthma due to environmental allergies 04/14/2015    Primary osteoarthritis of left knee 02/03/2015   Fibromyalgia 02/03/2015   Hyperlipidemia 02/03/2015   Benign hypertension 02/03/2015   Allergic rhinitis 02/03/2015    Past Surgical History:  Procedure Laterality Date   ABDOMINAL HYSTERECTOMY  01/26/2012   Total vaginal for precancerous cells (ovaries remain)   BILATERAL CARPAL TUNNEL RELEASE Bilateral    COLONOSCOPY WITH PROPOFOL N/A 03/29/2021   Procedure: COLONOSCOPY WITH PROPOFOL;  Surgeon: Robert Bellow, MD;  Location: [facility];  Service: Endoscopy;  Laterality: N/A;  DM   DILATION AND CURETTAGE OF UTERUS     HALLUX VALGUS CORRECTION Left     Prior to Admission medications   Medication Sig Start Date End Date Taking? Authorizing Provider  doxycycline (VIBRAMYCIN) 100 MG capsule Take 1 capsule (100 mg total) by mouth 2 (two) times daily for 7 days. 04/13/21 04/20/21 Yes Duffy Bruce, MD  furosemide (LASIX) 20 MG tablet Take 1 tablet (20 mg total) by mouth daily for 5 days. 04/13/21 04/18/21 Yes Duffy Bruce, MD  potassium chloride SA (KLOR-CON) 20 MEQ tablet Take 1 tablet (20 mEq total) by mouth daily for 5 days. 04/13/21 04/18/21 Yes Duffy Bruce, MD  aspirin EC 81 MG tablet Take 81 mg by mouth daily.    [provider]  Azelastine-Fluticasone 137-50 MCG/ACT SUSP Place 1 spray into the nose 2 (two) times daily. 02/03/15   Arnetha Courser, MD  benzonatate (  TESSALON) 100 MG capsule Take 200 mg by mouth 3 (three) times daily as needed for cough.    [provider]  Cinnamon Bark POWD 1 Dose by Does not apply route daily.    [provider]  clotrimazole-betamethasone (LOTRISONE) cream Apply topically. 09/08/14   [provider]  diphenhydrAMINE (BENADRYL) 25 mg capsule Take 25 mg by mouth daily.    [provider]  fluticasone (FLONASE) 50 MCG/ACT nasal spray Place 2 sprays into both nostrils daily.    [provider]  GLUCOSAMINE CHONDROITIN COMPLX PO Take 1 Dose by  mouth daily.    [provider]  ketoconazole (NIZORAL) 2 % cream Apply 1 application topically daily.    [provider]  metoprolol succinate (TOPROL-XL) 50 MG 24 hr tablet TAKE 1 TABLET MOUTH ONCE DAILY WITH A MEAL 04/20/16   Lada, Satira Anis, MD  montelukast (SINGULAIR) 10 MG tablet Take 1 tablet (10 mg total) by mouth at bedtime. Patient taking differently: Take 10 mg by mouth daily. 09/29/15   Arnetha Courser, MD  Omega 3-6-9 Fatty Acids (OMEGA 3-6-9 COMPLEX PO) Take 1 capsule by mouth daily. Patient not taking: Reported on 03/29/2021    [provider]  pilocarpine (SALAGEN) 5 MG tablet Take 5 mg by mouth 3 (three) times daily.    [provider]  simvastatin (ZOCOR) 10 MG tablet TAKE ONE TABLET BY MOUTH EVERY NIGHT AT BEDTIME 05/22/16   Lada, Satira Anis, MD  telmisartan (MICARDIS) 80 MG tablet Take 80 mg by mouth daily.    [provider]  traMADol (ULTRAM) 50 MG tablet Take by mouth every 6 (six) hours as needed.    [provider]  traZODone (DESYREL) 50 MG tablet TAKE 1/2 TO 1 TABLET BY MOUTH AT BEDTIMEAS NEEDED FOR SLEEP 12/24/15   Lada, Satira Anis, MD  Turmeric 500 MG CAPS Take 1 capsule by mouth daily. Patient not taking: Reported on 03/29/2021    [provider]  vitamin B-12 (CYANOCOBALAMIN) 1000 MCG tablet Take 1,000 mcg by mouth daily.    [provider]    Allergies Amlodipine besylate  Family History  Problem Relation Age of Onset   Hypertension Mother    Heart disease Mother 12       Bypass surgery   Cancer Father        lung   Heart disease Father 58       bypass surgery   Breast cancer Cousin     Social History Social History   Tobacco Use   Smoking status: Former    Packs/day: 0.50    Years: 10.00    Pack years: 5.00    Types: Cigarettes    Quit date: 12/26/1966    Years since quitting: 54.3   Smokeless tobacco: Never  Vaping Use   Vaping Use: Never used  Substance Use Topics   Alcohol  use: No   Drug use: No    Review of Systems  Review of Systems  Constitutional:  Positive for fatigue. Negative for chills and fever.  HENT:  Negative for congestion and sore throat.   Eyes:  Negative for visual disturbance.  Respiratory:  Negative for cough and shortness of breath.   Cardiovascular:  Positive for leg swelling. Negative for chest pain.  Gastrointestinal:  Negative for abdominal pain, diarrhea, nausea and vomiting.  Genitourinary:  Negative for flank pain.  Musculoskeletal:  Negative for back pain and neck pain.  Skin:  Negative for rash and wound.  Allergic/Immunologic: Negative for immunocompromised state.  Neurological:  Negative for weakness and numbness.  Hematological:  Does not bruise/bleed easily.  All other systems reviewed and are negative.   ____________________________________________  PHYSICAL EXAM:      VITAL SIGNS: ED Triage Vitals  Enc Vitals Group     BP 04/13/21 1214 (!) 159/68     Pulse Rate 04/13/21 1214 (!) 59     Resp 04/13/21 1214 18     Temp 04/13/21 1214 98.1 F (36.7 C)     Temp Source 04/13/21 1214 Oral     SpO2 04/13/21 1214 94 %     Weight 04/13/21 1225 250 lb (113.4 kg)     Height 04/13/21 1225 '5\' 7"'$  (1.702 m)     Head Circumference --      Peak Flow --      Pain Score 04/13/21 1225 0     Pain Loc --      Pain Edu? --      Excl. in [place]? --      Physical Exam Vitals and nursing note reviewed.  Constitutional:      General: She is not in acute distress.    Appearance: She is well-developed.  HENT:     Head: Normocephalic and atraumatic.  Eyes:     Conjunctiva/sclera: Conjunctivae normal.  Cardiovascular:     Rate and Rhythm: Normal rate and regular rhythm.     Heart sounds: Normal heart sounds. No murmur heard.   No friction rub.  Pulmonary:     Effort: Pulmonary effort is normal. No respiratory distress.     Breath sounds: Normal breath sounds. No wheezing or rales.  Abdominal:     General: There is no  distension.     Palpations: Abdomen is soft.     Tenderness: There is no abdominal tenderness.  Musculoskeletal:     Cervical back: Neck supple.     Right lower leg: Edema (2+) present.     Left lower leg: Edema (3+) present.  Skin:    General: Skin is warm.     Capillary Refill: Capillary refill takes less than 2 seconds.     Comments: Mild warmth and erythema noted of lower left shin. No open or draining wounds. No fluctuance.  Neurological:     Mental Status: She is alert and oriented to person, place, and time.     Motor: No abnormal muscle tone.      ____________________________________________   LABS (all labs ordered are listed, but only abnormal results are displayed)  Labs Reviewed  COMPREHENSIVE METABOLIC PANEL - Abnormal; Notable for the following components:      Result Value   Glucose, Bld 160 (*)    All other components within normal limits  CBC  BRAIN NATRIURETIC PEPTIDE  TSH    ____________________________________________  EKG:  ________________________________________  RADIOLOGY All imaging, including plain films, CT scans, and ultrasounds, independently reviewed by me, and interpretations confirmed via formal radiology reads.  ED MD interpretation:   Chest x-ray:No acute disease  Official radiology report(s): DG Chest 2 View  Result Date: 04/13/2021 CLINICAL DATA:  leg swelling, L>R EXAM: CHEST - 2 VIEW COMPARISON:  None. FINDINGS: Unchanged cardiomediastinal silhouette. There is no focal airspace consolidation. There is no large pleural effusion or visible pneumothorax. Thoracic spondylosis. No acute osseous abnormality IMPRESSION: No evidence of acute cardiopulmonary disease. Electronically Signed   By: Maurine Simmering M.D.   On: 04/13/2021 14:13   US Venous Img Lower Bilateral  Result  Date: 04/13/2021 CLINICAL DATA:  Bilateral lower extremity pain and edema, left greater than right. Evaluate for DVT. EXAM: BILATERAL LOWER EXTREMITY VENOUS DOPPLER  ULTRASOUND TECHNIQUE: Gray-scale sonography with graded compression, as well as color Doppler and duplex ultrasound were performed to evaluate the lower extremity deep venous systems from the level of the common femoral vein and including the common femoral, femoral, profunda femoral, popliteal and calf veins including the posterior tibial, peroneal and gastrocnemius veins when visible. The superficial great saphenous vein was also interrogated. Spectral Doppler was utilized to evaluate flow at rest and with distal augmentation maneuvers in the common femoral, femoral and popliteal veins. COMPARISON:  None. FINDINGS: RIGHT LOWER EXTREMITY Common Femoral Vein: No evidence of thrombus. Normal compressibility, respiratory phasicity and response to augmentation. Saphenofemoral Junction: No evidence of thrombus. Normal compressibility and flow on color Doppler imaging. Profunda Femoral Vein: No evidence of thrombus. Normal compressibility and flow on color Doppler imaging. Femoral Vein: No evidence of thrombus. Normal compressibility, respiratory phasicity and response to augmentation. Popliteal Vein: No evidence of thrombus. Normal compressibility, respiratory phasicity and response to augmentation. Calf Veins: No evidence of thrombus. Normal compressibility and flow on color Doppler imaging. Superficial Great Saphenous Vein: No evidence of thrombus. Normal compressibility. Venous Reflux:  None. Other Findings:  None. LEFT LOWER EXTREMITY Common Femoral Vein: No evidence of thrombus. Normal compressibility, respiratory phasicity and response to augmentation. Saphenofemoral Junction: No evidence of thrombus. Normal compressibility and flow on color Doppler imaging. Profunda Femoral Vein: No evidence of thrombus. Normal compressibility and flow on color Doppler imaging. Femoral Vein: No evidence of thrombus. Normal compressibility, respiratory phasicity and response to augmentation. Popliteal Vein: No evidence of  thrombus. Normal compressibility, respiratory phasicity and response to augmentation. Calf Veins: No evidence of thrombus. Normal compressibility and flow on color Doppler imaging. Superficial Great Saphenous Vein: No evidence of thrombus. Normal compressibility. Venous Reflux:  None. Other Findings:  None. IMPRESSION: No evidence of DVT within either lower extremity. Electronically Signed   By: Sandi Mariscal M.D.   On: 04/13/2021 16:04    ____________________________________________  PROCEDURES   Procedure(s) performed (including Critical Care):  Procedures  ____________________________________________  INITIAL IMPRESSION / MDM / [place] / ED COURSE  As part of my medical decision making, I reviewed the following data within the [place] notes reviewed and incorporated, Old chart reviewed, Notes from prior ED visits, and [place] Controlled Substance Database       *Carrie Wong was evaluated in Emergency Department on 04/13/2021 for the symptoms described in the history of present illness. She was evaluated in the context of the global COVID-19 pandemic, which necessitated consideration that the patient might be at risk for infection with the SARS-CoV-2 virus that causes COVID-19. Institutional protocols and algorithms that pertain to the evaluation of patients at risk for COVID-19 are in a state of rapid change based on information released by regulatory bodies including the CDC and federal and state organizations. These policies and algorithms were followed during the patient's care in the ED.  Some ED evaluations and interventions may be delayed as a result of limited staffing during the pandemic.*     Medical Decision Making: 80 year old female here with significant bilateral lower extremity edema.  This seems to be a somewhat chronic issue although worsening over the last several weeks.  No history of CHF, liver failure, renal failure.  Lab work reviewed.   Renal function is normal.  LFTs normal.  BNP normal.  No leukocytosis or anemia.  Chest x-ray reviewed and is without edema.  Ultrasounds obtained and are pending.  Suspect dependent edema, possibly exacerbated by recurrent prednisone use.  Given the extent of her swelling and limited mobility related to it, I do think it is pertinent to start her on a short course of Lasix.  Will give her replacement potassium with this and have her follow-up closely with PCP for repeat labs.  Plan to follow-up ultrasound and likely dispo if negative.  ____________________________________________  FINAL CLINICAL IMPRESSION(S) / ED DIAGNOSES  Final diagnoses:  Bilateral leg edema     MEDICATIONS GIVEN DURING THIS VISIT:  Medications  furosemide (LASIX) injection 40 mg (40 mg Intravenous Given 04/13/21 1407)     ED Discharge Orders          Ordered    furosemide (LASIX) 20 MG tablet  Daily        04/13/21 1441    potassium chloride SA (KLOR-CON) 20 MEQ tablet  Daily        04/13/21 1441    doxycycline (VIBRAMYCIN) 100 MG capsule  2 times daily        04/13/21 1503             Note:  This document was prepared using Dragon voice recognition software and may include unintentional dictation errors.   Duffy Bruce, MD 04/13/21 [phone]

## 2021-04-13 NOTE — Discharge Instructions (Addendum)
I'd recommend calling your doctor to set up an outpatient echocardiogram and repeat labs within the next week  Lasix is a diuretic that can lower your potassium. Try to eat or drink something high in potassium daily (like a banana) when taking it, along with the potassium supplement.

## 2021-04-20 DIAGNOSIS — I10 Essential (primary) hypertension: Secondary | ICD-10-CM | POA: Diagnosis not present

## 2021-04-20 DIAGNOSIS — Z6841 Body Mass Index (BMI) 40.0 and over, adult: Secondary | ICD-10-CM | POA: Diagnosis not present

## 2021-04-20 DIAGNOSIS — E119 Type 2 diabetes mellitus without complications: Secondary | ICD-10-CM | POA: Diagnosis not present

## 2021-04-20 DIAGNOSIS — R600 Localized edema: Secondary | ICD-10-CM | POA: Diagnosis not present

## 2021-05-22 DIAGNOSIS — J301 Allergic rhinitis due to pollen: Secondary | ICD-10-CM | POA: Diagnosis not present

## 2021-05-22 DIAGNOSIS — J019 Acute sinusitis, unspecified: Secondary | ICD-10-CM | POA: Diagnosis not present

## 2021-05-31 ENCOUNTER — Other Ambulatory Visit: Payer: Self-pay

## 2021-05-31 ENCOUNTER — Encounter: Payer: Self-pay | Admitting: Emergency Medicine

## 2021-05-31 ENCOUNTER — Observation Stay
Admission: EM | Admit: 2021-05-31 | Discharge: 2021-06-01 | Disposition: A | Discharge status: Home / Self Care | Payer: PPO | Attending: Internal Medicine | Admitting: Internal Medicine

## 2021-05-31 DIAGNOSIS — K219 Gastro-esophageal reflux disease without esophagitis: Secondary | ICD-10-CM | POA: Diagnosis not present

## 2021-05-31 DIAGNOSIS — Z79899 Other long term (current) drug therapy: Secondary | ICD-10-CM | POA: Diagnosis not present

## 2021-05-31 DIAGNOSIS — Z87891 Personal history of nicotine dependence: Secondary | ICD-10-CM | POA: Diagnosis not present

## 2021-05-31 DIAGNOSIS — K625 Hemorrhage of anus and rectum: Secondary | ICD-10-CM | POA: Diagnosis present

## 2021-05-31 DIAGNOSIS — Z20822 Contact with and (suspected) exposure to covid-19: Secondary | ICD-10-CM | POA: Diagnosis not present

## 2021-05-31 DIAGNOSIS — K5790 Diverticulosis of intestine, part unspecified, without perforation or abscess without bleeding: Secondary | ICD-10-CM | POA: Diagnosis not present

## 2021-05-31 DIAGNOSIS — K922 Gastrointestinal hemorrhage, unspecified: Secondary | ICD-10-CM | POA: Diagnosis not present

## 2021-05-31 DIAGNOSIS — Z7982 Long term (current) use of aspirin: Secondary | ICD-10-CM | POA: Diagnosis not present

## 2021-05-31 DIAGNOSIS — J45909 Unspecified asthma, uncomplicated: Secondary | ICD-10-CM | POA: Insufficient documentation

## 2021-05-31 DIAGNOSIS — I10 Essential (primary) hypertension: Secondary | ICD-10-CM | POA: Diagnosis not present

## 2021-05-31 DIAGNOSIS — E119 Type 2 diabetes mellitus without complications: Secondary | ICD-10-CM | POA: Insufficient documentation

## 2021-05-31 LAB — COMPREHENSIVE METABOLIC PANEL
ALT: 19 U/L (ref 0–44)
AST: 17 U/L (ref 15–41)
Albumin: 3.7 g/dL (ref 3.5–5.0)
Alkaline Phosphatase: 53 U/L (ref 38–126)
Anion gap: 5 (ref 5–15)
BUN: 11 mg/dL (ref 8–23)
CO2: 27 mmol/L (ref 22–32)
Calcium: 8.6 mg/dL — ABNORMAL LOW (ref 8.9–10.3)
Chloride: 101 mmol/L (ref 98–111)
Creatinine, Ser: 0.79 mg/dL (ref 0.44–1.00)
GFR, Estimated: >60 mL/min (ref >60)
Glucose, Bld: 164 mg/dL — ABNORMAL HIGH (ref 70–99)
Potassium: 4.4 mmol/L (ref 3.5–5.1)
Sodium: 133 mmol/L — ABNORMAL LOW (ref 135–145)
Total Bilirubin: 0.9 mg/dL (ref 0.3–1.2)
Total Protein: 6.4 g/dL — ABNORMAL LOW (ref 6.5–8.1)

## 2021-05-31 LAB — CBC
HCT: 45 % (ref 36.0–46.0)
Hemoglobin: 14.9 g/dL (ref 12.0–15.0)
MCH: 30.8 pg (ref 26.0–34.0)
MCHC: 33.1 g/dL (ref 30.0–36.0)
MCV: 93.2 fL (ref 80.0–100.0)
Platelets: 202 10*3/uL (ref 150–400)
RBC: 4.83 MIL/uL (ref 3.87–5.11)
RDW: 13 % (ref 11.5–15.5)
WBC: 9.4 10*3/uL (ref 4.0–10.5)
nRBC: 0 % (ref 0.0–0.2)

## 2021-05-31 LAB — HEMOGLOBIN: Hemoglobin: 14.4 g/dL (ref 12.0–15.0)

## 2021-05-31 LAB — RESP PANEL BY RT-PCR (FLU A&B, COVID) ARPGX2
Influenza A by PCR: NEGATIVE
Influenza B by PCR: NEGATIVE
SARS Coronavirus 2 by RT PCR: NEGATIVE

## 2021-05-31 LAB — TYPE AND SCREEN
ABO/RH(D): A POS
Antibody Screen: NEGATIVE

## 2021-05-31 NOTE — ED Provider Notes (Signed)
[facility] Emergency Department Provider Note   ____________________________________________   Event Date/Time   First MD Initiated Contact with Patient 05/31/21 1635     (approximate)  I have reviewed the triage vital signs and the nursing notes.   HISTORY  Chief Complaint Rectal Bleeding    HPI Carrie Wong is a 80 y.o. female with past medical history of hypertension, hyperlipidemia, diabetes, asthma, and fibromyalgia who presents to the ED complaining of bloody stools.  Patient reports that for the past 2 days she has been dealing with bowel movements mixed with bright red blood.  She states this is occurred every time she has had a bowel movement for the past 2 days with multiple bowel movements per day.  She reports mild associated abdominal cramping as well as pain in her lower back.  She has not had any nausea or vomiting.  She does not take any blood thinners and denies any chest pain, shortness of breath, or lightheadedness.  She reports dealing with similar but milder bleeding a couple of months ago, had a colonoscopy at that time that was unremarkable.        Past Medical History:  Diagnosis Date   Allergy    Arthritis of knee, left    Asthma    Diabetes mellitus without complication (HCC)    Fibromyalgia    Fibromyalgia    GERD (gastroesophageal reflux disease)    Hip arthritis    Hyperlipidemia    Hypertension    Obesity    Plantar fasciitis    Sleep apnea    Tinea    Type 2 diabetes mellitus ([place]) 10/22/2015    Patient Active Problem List   Diagnosis Date Noted   Type 2 diabetes mellitus ([place]) 10/22/2015   Colon cancer screening 10/18/2015   Chronic shoulder pain 10/18/2015   Insomnia 10/18/2015   Obesity 10/18/2015   Medication monitoring encounter 04/14/2015   Breast cancer screening 04/14/2015   Urinary, incontinence, stress female 04/14/2015   Asthma due to environmental allergies 04/14/2015   Primary osteoarthritis  of left knee 02/03/2015   Fibromyalgia 02/03/2015   Hyperlipidemia 02/03/2015   Benign hypertension 02/03/2015   Allergic rhinitis 02/03/2015    Past Surgical History:  Procedure Laterality Date   ABDOMINAL HYSTERECTOMY  01/26/2012   Total vaginal for precancerous cells (ovaries remain)   BILATERAL CARPAL TUNNEL RELEASE Bilateral    COLONOSCOPY WITH PROPOFOL N/A 03/29/2021   Procedure: COLONOSCOPY WITH PROPOFOL;  Surgeon: Robert Bellow, MD;  Location: [place];  Service: Endoscopy;  Laterality: N/A;  DM   DILATION AND CURETTAGE OF UTERUS     HALLUX VALGUS CORRECTION Left     Prior to Admission medications   Medication Sig Start Date End Date Taking? Authorizing Provider  aspirin EC 81 MG tablet Take 81 mg by mouth daily.    [provider]  Azelastine-Fluticasone 137-50 MCG/ACT SUSP Place 1 spray into the nose 2 (two) times daily. 02/03/15   Lada, Satira Anis, MD  benzonatate (TESSALON) 100 MG capsule Take 200 mg by mouth 3 (three) times daily as needed for cough.    [provider]  Cinnamon Bark POWD 1 Dose by Does not apply route daily.    [provider]  clotrimazole-betamethasone (LOTRISONE) cream Apply topically. 09/08/14   [provider]  diphenhydrAMINE (BENADRYL) 25 mg capsule Take 25 mg by mouth daily.    [provider]  fluticasone (FLONASE) 50 MCG/ACT nasal spray Place 2 sprays into  both nostrils daily.    [provider]  furosemide (LASIX) 20 MG tablet Take 1 tablet (20 mg total) by mouth daily for 5 days. 04/13/21 04/18/21  Duffy Bruce, MD  GLUCOSAMINE CHONDROITIN COMPLX PO Take 1 Dose by mouth daily.    [provider]  ketoconazole (NIZORAL) 2 % cream Apply 1 application topically daily.    [provider]  metoprolol succinate (TOPROL-XL) 50 MG 24 hr tablet TAKE 1 TABLET MOUTH ONCE DAILY WITH A MEAL 04/20/16   Lada, Satira Anis, MD  montelukast (SINGULAIR) 10 MG tablet Take 1 tablet (10 mg  total) by mouth at bedtime. Patient taking differently: Take 10 mg by mouth daily. 09/29/15   Arnetha Courser, MD  Omega 3-6-9 Fatty Acids (OMEGA 3-6-9 COMPLEX PO) Take 1 capsule by mouth daily. Patient not taking: Reported on 03/29/2021    [provider]  pilocarpine (SALAGEN) 5 MG tablet Take 5 mg by mouth 3 (three) times daily.    [provider]  potassium chloride SA (KLOR-CON) 20 MEQ tablet Take 1 tablet (20 mEq total) by mouth daily for 5 days. 04/13/21 04/18/21  Duffy Bruce, MD  simvastatin (ZOCOR) 10 MG tablet TAKE ONE TABLET BY MOUTH EVERY NIGHT AT BEDTIME 05/22/16   Lada, Satira Anis, MD  telmisartan (MICARDIS) 80 MG tablet Take 80 mg by mouth daily.    [provider]  traMADol (ULTRAM) 50 MG tablet Take by mouth every 6 (six) hours as needed.    [provider]  traZODone (DESYREL) 50 MG tablet TAKE 1/2 TO 1 TABLET BY MOUTH AT BEDTIMEAS NEEDED FOR SLEEP 12/24/15   Lada, Satira Anis, MD  Turmeric 500 MG CAPS Take 1 capsule by mouth daily. Patient not taking: Reported on 03/29/2021    [provider]  vitamin B-12 (CYANOCOBALAMIN) 1000 MCG tablet Take 1,000 mcg by mouth daily.    [provider]    Allergies Amlodipine besylate  Family History  Problem Relation Age of Onset   Hypertension Mother    Heart disease Mother 31       Bypass surgery   Cancer Father        lung   Heart disease Father 93       bypass surgery   Breast cancer Cousin     Social History Social History   Tobacco Use   Smoking status: Former    Packs/day: 0.50    Years: 10.00    Pack years: 5.00    Types: Cigarettes    Quit date: 12/26/1966    Years since quitting: 54.4   Smokeless tobacco: Never  Vaping Use   Vaping Use: Never used  Substance Use Topics   Alcohol use: No   Drug use: No    Review of Systems  Constitutional: No fever/chills Eyes: No visual changes. ENT: No sore throat. Cardiovascular: Denies chest pain. Respiratory: Denies  shortness of breath. Gastrointestinal: No abdominal pain.  No nausea, no vomiting.  No diarrhea.  No constipation.  Positive for bloody stool. Genitourinary: Negative for dysuria. Musculoskeletal: Negative for back pain. Skin: Negative for rash. Neurological: Negative for headaches, focal weakness or numbness.  ____________________________________________   PHYSICAL EXAM:  VITAL SIGNS: ED Triage Vitals  Enc Vitals Group     BP 05/31/21 1457 (!) 150/82     Pulse Rate 05/31/21 1457 68     Resp 05/31/21 1457 19     Temp 05/31/21 1457 98.3 F (36.8 C)     Temp Source 05/31/21  1457 Oral     SpO2 05/31/21 1457 98 %     Weight 05/31/21 1457 250 lb (113.4 kg)     Height 05/31/21 1457 5\' 7"  (1.702 m)     Head Circumference --      Peak Flow --      Pain Score 05/31/21 1456 1     Pain Loc --      Pain Edu? --      Excl. in [place]? --     Constitutional: Alert and oriented. Eyes: Conjunctivae are normal. Head: Atraumatic. Nose: No congestion/rhinnorhea. Mouth/Throat: Mucous membranes are moist. Neck: Normal ROM Cardiovascular: Normal rate, regular rhythm. Grossly normal heart sounds.  2+ radial pulses bilaterally. Respiratory: Normal respiratory effort.  No retractions. Lungs CTAB. Gastrointestinal: Soft and nontender. No distention. Genitourinary: deferred Musculoskeletal: No lower extremity tenderness nor edema. Neurologic:  Normal speech and language. No gross focal neurologic deficits are appreciated. Skin:  Skin is warm, dry and intact. No rash noted. Psychiatric: Mood and affect are normal. Speech and behavior are normal.  ____________________________________________   LABS (all labs ordered are listed, but only abnormal results are displayed)  Labs Reviewed  COMPREHENSIVE METABOLIC PANEL - Abnormal; Notable for the following components:      Result Value   Sodium 133 (*)    Glucose, Bld 164 (*)    Calcium 8.6 (*)    Total Protein 6.4 (*)    All other components  within normal limits  RESP PANEL BY RT-PCR (FLU A&B, COVID) ARPGX2  CBC  POC OCCULT BLOOD, ED  TYPE AND SCREEN    PROCEDURES  Procedure(s) performed (including Critical Care):  Procedures   ____________________________________________   INITIAL IMPRESSION / ASSESSMENT AND PLAN / ED COURSE      80 year old female with past medical history of hypertension, hyperlipidemia, diabetes, asthma, and fibromyalgia who presents to the ED complaining of bloody bowel movements for the past 2 days.  Patient reports mild associated abdominal pain but has no tenderness on exam.  Labs are reassuring with stable hemoglobin, however patient had a large bloody bowel movement shortly after arrival to the ED.  She would benefit from admission to the hospital for observation to ensure no worsening bleeding as well as for GI evaluation to consider colonoscopy.  Plan to discuss with hospitalist for admission, patient agrees with plan.      ____________________________________________   FINAL CLINICAL IMPRESSION(S) / ED DIAGNOSES  Final diagnoses:  Lower GI bleed     ED Discharge Orders     None        Note:  This document was prepared using Dragon voice recognition software and may include unintentional dictation errors.    Blake Divine, MD 05/31/21 1725

## 2021-05-31 NOTE — ED Notes (Signed)
ED provider at bedside at this time 

## 2021-05-31 NOTE — ED Notes (Signed)
Pt seen in room at this time AAOx4, respi even-unlabored, soft non-tender abd .  Pt changed into hospital gown at this time

## 2021-05-31 NOTE — ED Triage Notes (Signed)
Pt comes into the ED via POV c/o rectal bleeding that has been ongoing a couple days. Pt states it is bright red in color.  She has had a colonoscopy done in the past for the same problems and they werent able to find anything.  Pt denies any blood thinner use.  Pt admits to mild abdominal discomfort.

## 2021-05-31 NOTE — ED Notes (Signed)
Patient denies pain and is resting comfortably.  

## 2021-05-31 NOTE — ED Notes (Signed)
Pt sitting up on side of bed drinking gingerale, eating graham crackers. Pt denies any pain at this time. Call bell in reach.

## 2021-05-31 NOTE — H&P (Signed)
[place] [facility] [place] NAME: Marguita Venning    MR#:  [id number]  DATE OF BIRTH:  1940-10-19  DATE OF ADMISSION:  05/31/2021  PRIMARY CARE PHYSICIAN: Maryland Pink, MD   REQUESTING/REFERRING PHYSICIAN: Dr Charna Archer  Patient coming from : home  CHIEF COMPLAINT:  Rectal bleeding on and off for 3 days--more today  HISTORY OF PRESENT ILLNESS:  Jeannie Mallinger  is a 80 y.o. female with a known history of arthritis, diabetes, fibromyalgia, Gerd, hypertension and hyperlipidemia comes to the emergency room from home with rectal bleed on and off for 3 to 4 days. Patient denies any blood thinners other than baby aspirin. She denies any history of constipation takes MiraLAX daily.  She recently had colonoscopy in August 2022.  ED course: patient is hemodynamically stable. First hemoglobin was 14.6. Thereafter she had a large blood he BM. Repeat hemoglobin is pending. Blood pressure is stable. Patient denies any abdominal pain nausea or vomiting. She denies any dizzy  patient is being admitted for lower G.I. bleed/rectal bleed suspect diverticular. No family in the ER. PAST MEDICAL HISTORY:   Past Medical History:  Diagnosis Date   Allergy    Arthritis of knee, left    Asthma    Diabetes mellitus without complication (HCC)    Fibromyalgia    Fibromyalgia    GERD (gastroesophageal reflux disease)    Hip arthritis    Hyperlipidemia    Hypertension    Obesity    Plantar fasciitis    Sleep apnea    Tinea    Type 2 diabetes mellitus ([place]) 10/22/2015    PAST SURGICAL HISTOIRY:   Past Surgical History:  Procedure Laterality Date   ABDOMINAL HYSTERECTOMY  01/26/2012   Total vaginal for precancerous cells (ovaries remain)   BILATERAL CARPAL TUNNEL RELEASE Bilateral    COLONOSCOPY WITH PROPOFOL N/A 03/29/2021   Procedure: COLONOSCOPY WITH PROPOFOL;  Surgeon: Robert Bellow, MD;  Location: [place];  Service: Endoscopy;  Laterality: N/A;  DM    DILATION AND CURETTAGE OF UTERUS     HALLUX VALGUS CORRECTION Left     SOCIAL HISTORY:   Social History   Tobacco Use   Smoking status: Former    Packs/day: 0.50    Years: 10.00    Pack years: 5.00    Types: Cigarettes    Quit date: 12/26/1966    Years since quitting: 54.4   Smokeless tobacco: Never  Substance Use Topics   Alcohol use: No    FAMILY HISTORY:   Family History  Problem Relation Age of Onset   Hypertension Mother    Heart disease Mother 91       Bypass surgery   Cancer Father        lung   Heart disease Father 83       bypass surgery   Breast cancer Cousin     DRUG ALLERGIES:   Allergies  Allergen Reactions   Amlodipine Besylate Swelling    REVIEW OF SYSTEMS:  Review of Systems  Constitutional:  Negative for chills, fever and weight loss.  HENT:  Negative for ear discharge, ear pain and nosebleeds.   Eyes:  Negative for blurred vision, pain and discharge.  Respiratory:  Negative for sputum production, shortness of breath, wheezing and stridor.   Cardiovascular:  Negative for chest pain, palpitations, orthopnea and PND.  Gastrointestinal:  Positive for blood in stool. Negative for abdominal pain, diarrhea, nausea and vomiting.  Genitourinary:  Negative for  frequency and urgency.  Musculoskeletal:  Negative for back pain and joint pain.  Neurological:  Positive for weakness. Negative for sensory change, speech change and focal weakness.  Psychiatric/Behavioral:  Negative for depression and hallucinations. The patient is not nervous/anxious.     MEDICATIONS AT HOME:   Prior to Admission medications   Medication Sig Start Date End Date Taking? Authorizing Provider  aspirin EC 81 MG tablet Take 81 mg by mouth daily.    [provider]  Azelastine-Fluticasone 137-50 MCG/ACT SUSP Place 1 spray into the nose 2 (two) times daily. 02/03/15   Lada, Satira Anis, MD  benzonatate (TESSALON) 100 MG capsule Take 200 mg by mouth 3 (three) times daily as  needed for cough.    [provider]  Cinnamon Bark POWD 1 Dose by Does not apply route daily.    [provider]  clotrimazole-betamethasone (LOTRISONE) cream Apply topically. 09/08/14   [provider]  diphenhydrAMINE (BENADRYL) 25 mg capsule Take 25 mg by mouth daily.    [provider]  fluticasone (FLONASE) 50 MCG/ACT nasal spray Place 2 sprays into both nostrils daily.    [provider]  furosemide (LASIX) 20 MG tablet Take 1 tablet (20 mg total) by mouth daily for 5 days. 04/13/21 04/18/21  Duffy Bruce, MD  GLUCOSAMINE CHONDROITIN COMPLX PO Take 1 Dose by mouth daily.    [provider]  ketoconazole (NIZORAL) 2 % cream Apply 1 application topically daily.    [provider]  metoprolol succinate (TOPROL-XL) 50 MG 24 hr tablet TAKE 1 TABLET MOUTH ONCE DAILY WITH A MEAL 04/20/16   Lada, Satira Anis, MD  montelukast (SINGULAIR) 10 MG tablet Take 1 tablet (10 mg total) by mouth at bedtime. Patient taking differently: Take 10 mg by mouth daily. 09/29/15   Lada, Satira Anis, MD  pilocarpine (SALAGEN) 5 MG tablet Take 5 mg by mouth 3 (three) times daily.    [provider]  potassium chloride SA (KLOR-CON) 20 MEQ tablet Take 1 tablet (20 mEq total) by mouth daily for 5 days. 04/13/21 04/18/21  Duffy Bruce, MD  simvastatin (ZOCOR) 10 MG tablet TAKE ONE TABLET BY MOUTH EVERY NIGHT AT BEDTIME 05/22/16   Lada, Satira Anis, MD  telmisartan (MICARDIS) 80 MG tablet Take 80 mg by mouth daily.    [provider]  traMADol (ULTRAM) 50 MG tablet Take by mouth every 6 (six) hours as needed.    [provider]  traZODone (DESYREL) 50 MG tablet TAKE 1/2 TO 1 TABLET BY MOUTH AT BEDTIMEAS NEEDED FOR SLEEP 12/24/15   Lada, Satira Anis, MD  vitamin B-12 (CYANOCOBALAMIN) 1000 MCG tablet Take 1,000 mcg by mouth daily.    [provider]      VITAL SIGNS:  Blood pressure 129/64, pulse 62, temperature 98.3 F (36.8 C),  temperature source Oral, resp. rate 18, height 5\' 7"  (1.702 m), weight 113.4 kg, SpO2 100 %.  PHYSICAL EXAMINATION:  GENERAL:  80 y.o.-year-old patient lying in the bed with no acute distress.  EYES: Pupils equal, round, reactive to light and accommodation. No scleral icterus.  HEENT: Head atraumatic, normocephalic. Oropharynx and nasopharynx clear.  NECK:  Supple, no jugular venous distention. No thyroid enlargement, no tenderness.  LUNGS: Normal breath sounds bilaterally, no wheezing, rales,rhonchi or crepitation. No use of accessory muscles of respiration.  CARDIOVASCULAR: S1, S2 normal. No murmurs, rubs, or gallops.  ABDOMEN: Soft, nontender, nondistended. Bowel sounds present. No organomegaly or mass.  EXTREMITIES: No pedal edema, cyanosis,  or clubbing.  NEUROLOGIC: Cranial nerves II through XII are intact. Muscle strength 5/5 in all extremities. Sensation intact. Gait not checked.  PSYCHIATRIC: The patient is alert and oriented x 3.  SKIN: No obvious rash, lesion, or ulcer.   LABORATORY PANEL:   CBC Recent Labs  Lab 05/31/21 1458  WBC 9.4  HGB 14.9  HCT 45.0  PLT 202   ------------------------------------------------------------------------------------------------------------------  Chemistries  Recent Labs  Lab 05/31/21 1458  NA 133*  K 4.4  CL 101  CO2 27  GLUCOSE 164*  BUN 11  CREATININE 0.79  CALCIUM 8.6*  AST 17  ALT 19  ALKPHOS 53  BILITOT 0.9   ------------------------------------------------------------------------------------------------------------------  Cardiac Enzymes No results for input(s): TROPONINI in the last 168 hours. ------------------------------------------------------------------------------------------------------------------  RADIOLOGY:  No results found.  EKG:    IMPRESSION AND PLAN:   Yunique Dearcos  is a 80 y.o. female with a known history of arthritis, diabetes, fibromyalgia, Gerd, hypertension and hyperlipidemia comes to the  emergency room from home with rectal bleed on and off for 3 to 4 days. Patient denies any blood thinners other than baby aspirin.  Rectal bleed/lower G.I. bleed suspect diverticular -- patient came in with on and off bloody stool. Had large one in the emergency room today. -- Hemoglobin 14.6 -- repeat hemoglobin now and every 12 hour -- IV fluid -- clear liquid diet -- G.I. consultation with Dr. Virgina Jock informed -- patient recently had colonoscopy  on 03/2021 as outpatientFour 5 mm polyps in the rectum, in the proximal rectum, in the distal rectum, in the transverse colon, in the proximal transverse colon, in the ascending colon and in the distal ascending colon. Biopsied. - Diverticulosis in the recto-sigmoid colon and in the sigmoid colon. - The distal rectum and anal verge are normal on retroflexion view.  Hypertension -- hold BP meds. Blood pressure stable. -- Will resume as able to  degenerative arthritis -- PRN tramadol  Gerd PPI  history of asthma -- stable -- continue Singulair   Family Communication :none Consults :GI Code Status :FULL d/w pt DVT prophylaxis :SCD Level of care: Med-Surg  TOTAL TIME TAKING CARE OF THIS PATIENT: 45 minutes.    Fritzi Mandes M.D  [facility]     CC: Primary care physician; Maryland Pink, MD

## 2021-05-31 NOTE — ED Notes (Signed)
Pt resting with eyes closed. Awaken easily. Denies any needs. Call bell in reach.

## 2021-06-01 DIAGNOSIS — K922 Gastrointestinal hemorrhage, unspecified: Secondary | ICD-10-CM | POA: Diagnosis not present

## 2021-06-01 DIAGNOSIS — K5790 Diverticulosis of intestine, part unspecified, without perforation or abscess without bleeding: Secondary | ICD-10-CM | POA: Diagnosis not present

## 2021-06-01 DIAGNOSIS — K219 Gastro-esophageal reflux disease without esophagitis: Secondary | ICD-10-CM | POA: Diagnosis not present

## 2021-06-01 DIAGNOSIS — K921 Melena: Secondary | ICD-10-CM | POA: Diagnosis not present

## 2021-06-01 DIAGNOSIS — I10 Essential (primary) hypertension: Secondary | ICD-10-CM | POA: Diagnosis not present

## 2021-06-01 LAB — HEMOGLOBIN
Hemoglobin: 14.9 g/dL (ref 12.0–15.0)
Hemoglobin: 14.2 g/dL (ref 12.0–15.0)

## 2021-06-01 MED ORDER — SIMVASTATIN 10 MG PO TABS
10.0000 mg | ORAL_TABLET | Freq: Every day | ORAL | Status: DC
  Administered 2021-06-01: 10 mg via ORAL
  Filled 2021-06-01 (×2): qty 1

## 2021-06-01 MED ORDER — SODIUM CHLORIDE 0.9 % IV SOLN: INTRAVENOUS | Status: DC

## 2021-06-01 MED ORDER — ACETAMINOPHEN 650 MG RE SUPP
650.0000 mg | Freq: Four times a day (QID) | RECTAL | Status: DC | PRN
  Filled 2021-06-01: qty 1

## 2021-06-01 MED ORDER — ONDANSETRON HCL 4 MG/2ML IJ SOLN: 4.0000 mg | Freq: Four times a day (QID) | INTRAMUSCULAR | Status: DC | PRN

## 2021-06-01 MED ORDER — ONDANSETRON HCL 4 MG PO TABS: 4.0000 mg | ORAL_TABLET | Freq: Four times a day (QID) | ORAL | Status: DC | PRN

## 2021-06-01 MED ORDER — TRAMADOL HCL 50 MG PO TABS: 50.0000 mg | ORAL_TABLET | Freq: Four times a day (QID) | ORAL | Status: DC | PRN

## 2021-06-01 MED ORDER — BENZONATATE 100 MG PO CAPS: 200.0000 mg | ORAL_CAPSULE | Freq: Three times a day (TID) | ORAL | Status: DC | PRN

## 2021-06-01 MED ORDER — FLUTICASONE PROPIONATE 50 MCG/ACT NA SUSP
2.0000 | Freq: Every day | NASAL | Status: DC
  Administered 2021-06-01: 2 via NASAL
  Filled 2021-06-01: qty 16

## 2021-06-01 MED ORDER — MONTELUKAST SODIUM 10 MG PO TABS: 10.0000 mg | ORAL_TABLET | Freq: Every day | ORAL | Status: DC

## 2021-06-01 MED ORDER — VITAMIN B-12 1000 MCG PO TABS
1000.0000 ug | ORAL_TABLET | Freq: Every day | ORAL | Status: DC
  Administered 2021-06-01: 1000 ug via ORAL
  Filled 2021-06-01: qty 1

## 2021-06-01 MED ORDER — TRAZODONE HCL 50 MG PO TABS: 50.0000 mg | ORAL_TABLET | Freq: Every evening | ORAL | Status: DC | PRN

## 2021-06-01 MED ORDER — DIPHENHYDRAMINE HCL 25 MG PO CAPS
25.0000 mg | ORAL_CAPSULE | Freq: Every day | ORAL | Status: DC
  Administered 2021-06-01: 25 mg via ORAL
  Filled 2021-06-01: qty 1

## 2021-06-01 MED ORDER — ACETAMINOPHEN 325 MG PO TABS: 650.0000 mg | ORAL_TABLET | Freq: Four times a day (QID) | ORAL | Status: DC | PRN

## 2021-06-01 MED ORDER — PILOCARPINE HCL 5 MG PO TABS
5.0000 mg | ORAL_TABLET | Freq: Three times a day (TID) | ORAL | Status: DC
  Administered 2021-06-01: 5 mg via ORAL
  Filled 2021-06-01 (×3): qty 1

## 2021-06-01 NOTE — Progress Notes (Signed)
PIV removed. AVS reviewed with patient. Patient verbalizes understanding of necessary follow up appointments and s/s of bleeding

## 2021-06-01 NOTE — Discharge Summary (Signed)
Carrie Wong at [facility] NAME: Carrie Wong    MR#:  [id number]  DATE OF BIRTH:  1940-12-06  DATE OF ADMISSION:  05/31/2021 ADMITTING PHYSICIAN: Fritzi Mandes, MD  DATE OF DISCHARGE: 06/01/2021  PRIMARY CARE PHYSICIAN: Maryland Pink, MD    ADMISSION DIAGNOSIS:  Lower GI bleed [K92.2] Rectal bleed [K62.5]  DISCHARGE DIAGNOSIS:  Rectal bleed suspected Diverticular  SECONDARY DIAGNOSIS:   Past Medical History:  Diagnosis Date   Allergy    Arthritis of knee, left    Asthma    Diabetes mellitus without complication (HCC)    Fibromyalgia    Fibromyalgia    GERD (gastroesophageal reflux disease)    Hip arthritis    Hyperlipidemia    Hypertension    Obesity    Plantar fasciitis    Sleep apnea    Tinea    Type 2 diabetes mellitus ([place]) 10/22/2015    HOSPITAL COURSE:   Carrie Wong  is a 80 y.o. female with a known history of arthritis, diabetes, fibromyalgia, Gerd, hypertension and hyperlipidemia comes to the emergency room from home with rectal bleed on and off for 3 to 4 days. Patient denies any blood thinners other than baby aspirin. She denies any history of constipation takes MiraLAX daily. [name]  is a 80 y.o. female with a known history of arthritis, diabetes, fibromyalgia, Carrie Wong, hypertension and hyperlipidemia comes to the emergency room from home with rectal bleed on and off for 3 to 4 days. Patient denies any blood thinners other than baby aspirin.   Rectal bleed/lower G.I. bleed suspect diverticular -- patient came in with on and off bloody stool. Had large one in the emergency room today. -- Hemoglobin 14.6 -- repeat hemoglobin now and every 12 hour--has remained more then 12.6 -- received IV fluid -- clear liquid diet--advance to reg -- G.I. consultation with Dr. Virgina Jock appreciated-- recommends patient follow-up with Dr. Bary Castilla surgery to see if she would be a candidate for surgical intervention renew future. -- patient  recently had colonoscopy  on 03/2021 as outpatientFour 5 mm polyps in the rectum, in the proximal rectum, in the distal rectum, in the transverse colon, in the proximal transverse colon, in the ascending colon and in the distal ascending colon. Biopsied. - Diverticulosis in the recto-sigmoid colon and in the sigmoid colon. - The distal rectum and anal verge are normal on retroflexion view. --10/6 --small blood in stool earleir. Repeat BM --no blood.  -- hemodynamically stable. She is okay to go home.   Hypertension -- hold BP meds. Blood pressure stable. -- Will resume at d/c   degenerative arthritis -- PRN tramadol   Gerd PPI   history of asthma -- stable -- continue Singulair     Family Communication :none Consults :GI Code Status :FULL d/w pt DVT prophylaxis :SCD Level of care: Med-Surg  overall hemodynamically stable. Patient will discharged to home with outpatient follow-up with Dr. Bary Castilla.  CONSULTS OBTAINED:  Treatment Team:  Annamaria Helling, DO  DRUG ALLERGIES:   Allergies  Allergen Reactions   Amlodipine Besylate Swelling    DISCHARGE MEDICATIONS:   Allergies as of 06/01/2021       Reactions   Amlodipine Besylate Swelling        Medication List     STOP taking these medications    amoxicillin-clavulanate 875-125 MG tablet Commonly known as: AUGMENTIN   benzonatate 100 MG capsule Commonly known as: TESSALON   furosemide 20 MG tablet Commonly known  as: Lasix   potassium chloride SA 20 MEQ tablet Commonly known as: KLOR-CON       TAKE these medications    albuterol 108 (90 Base) MCG/ACT inhaler Commonly known as: VENTOLIN HFA 2 puffs q.i.d. p.r.n. short of breath, wheezing, or cough   aspirin EC 81 MG tablet Take 81 mg by mouth daily.   Azelastine-Fluticasone 137-50 MCG/ACT Susp Place 1 spray into the nose 2 (two) times daily.   Cinnamon Bark Powd 1 Dose by Does not apply route daily.   clotrimazole-betamethasone  cream Commonly known as: LOTRISONE Apply topically.   diphenhydrAMINE 25 mg capsule Commonly known as: BENADRYL Take 25 mg by mouth daily.   fluticasone 50 MCG/ACT nasal spray Commonly known as: FLONASE Place 2 sprays into both nostrils daily. What changed: Another medication with the same name was removed. Continue taking this medication, and follow the directions you see here.   GLUCOSAMINE CHONDROITIN COMPLX PO Take 1 Dose by mouth daily.   ketoconazole 2 % cream Commonly known as: NIZORAL Apply 1 application topically daily.   metoprolol succinate 50 MG 24 hr tablet Commonly known as: TOPROL-XL TAKE 1 TABLET MOUTH ONCE DAILY WITH A MEAL   montelukast 10 MG tablet Commonly known as: SINGULAIR Take 1 tablet (10 mg total) by mouth at bedtime. What changed: when to take this   pilocarpine 5 MG tablet Commonly known as: SALAGEN Take 5 mg by mouth 3 (three) times daily. What changed: Another medication with the same name was removed. Continue taking this medication, and follow the directions you see here.   simvastatin 10 MG tablet Commonly known as: ZOCOR TAKE ONE TABLET BY MOUTH EVERY NIGHT AT BEDTIME   telmisartan 80 MG tablet Commonly known as: MICARDIS Take 80 mg by mouth daily.   traMADol 50 MG tablet Commonly known as: ULTRAM Take by mouth every 6 (six) hours as needed. What changed: Another medication with the same name was removed. Continue taking this medication, and follow the directions you see here.   traZODone 50 MG tablet Commonly known as: DESYREL TAKE 1/2 TO 1 TABLET BY MOUTH AT BEDTIMEAS NEEDED FOR SLEEP   vitamin B-12 1000 MCG tablet Commonly known as: CYANOCOBALAMIN Take 1,000 mcg by mouth daily. What changed: Another medication with the same name was removed. Continue taking this medication, and follow the directions you see here.        If you experience worsening of your admission symptoms, develop shortness of breath, life threatening  emergency, suicidal or homicidal thoughts you must seek medical attention immediately by calling 911 or calling your MD immediately  if symptoms less severe.  You Must read complete instructions/literature along with all the possible adverse reactions/side effects for all the Medicines you take and that have been prescribed to you. Take any new Medicines after you have completely understood and accept all the possible adverse reactions/side effects.   Please note  You were cared for by a hospitalist during your hospital stay. If you have any questions about your discharge medications or the care you received while you were in the hospital after you are discharged, you can call the unit and asked to speak with the hospitalist on call if the hospitalist that took care of you is not available. Once you are discharged, your primary care physician will handle any further medical issues. Please note that NO REFILLS for any discharge medications will be authorized once you are discharged, as it is imperative that you return to your primary  care physician (or establish a relationship with a primary care physician if you do not have one) for your aftercare needs so that they can reassess your need for medications and monitor your lab values. Today   SUBJECTIVE  No abd pain   VITAL SIGNS:  Blood pressure (!) 149/74, pulse (!) 55, temperature 97.6 F (36.4 C), temperature source Oral, resp. rate 16, height 5\' 7"  (1.702 m), weight 113.4 kg, SpO2 100 %.  I/O:   Intake/Output Summary (Last 24 hours) at 06/01/2021 1448 Last data filed at 06/01/2021 1435 Gross per 24 hour  Intake 720 ml  Output 1 ml  Net 719 ml    PHYSICAL EXAMINATION:  GENERAL:  80 y.o.-year-old patient lying in the bed with no acute distress.  EYES: Pupils equal, round, reactive to light and accommodation. No scleral icterus.  HEENT: Head atraumatic, normocephalic. Oropharynx and nasopharynx clear.  NECK:  Supple, no jugular venous  distention. No thyroid enlargement, no tenderness.  LUNGS: Normal breath sounds bilaterally, no wheezing, rales,rhonchi or crepitation. No use of accessory muscles of respiration.  CARDIOVASCULAR: S1, S2 normal. No murmurs, rubs, or gallops.  ABDOMEN: Soft, non-tender, non-distended. Bowel sounds present. No organomegaly or mass.  EXTREMITIES: No pedal edema, cyanosis, or clubbing.  NEUROLOGIC: Cranial nerves II through XII are intact. Muscle strength 5/5 in all extremities. Sensation intact. Gait not checked.  PSYCHIATRIC: The patient is alert and oriented x 3.  SKIN: No obvious rash, lesion, or ulcer.   DATA REVIEW:   CBC  Recent Labs  Lab 05/31/21 1458 05/31/21 1734 06/01/21 1250  WBC 9.4  --   --   HGB 14.9   < > 14.2  HCT 45.0  --   --   PLT 202  --   --    < > = values in this interval not displayed.    Chemistries  Recent Labs  Lab 05/31/21 1458  NA 133*  K 4.4  CL 101  CO2 27  GLUCOSE 164*  BUN 11  CREATININE 0.79  CALCIUM 8.6*  AST 17  ALT 19  ALKPHOS 53  BILITOT 0.9    Microbiology Results   Recent Results (from the past 240 hour(s))  Resp Panel by RT-PCR (Flu A&B, Covid) Nasopharyngeal Swab     Status: None   Collection Time: 05/31/21  5:21 PM   Specimen: Nasopharyngeal Swab; Nasopharyngeal(NP) swabs in vial transport medium  Result Value Ref Range Status   SARS Coronavirus 2 by RT PCR NEGATIVE NEGATIVE Final    Comment: (NOTE) SARS-CoV-2 target nucleic acids are NOT DETECTED.  The SARS-CoV-2 RNA is generally detectable in upper respiratory specimens during the acute phase of infection. The lowest concentration of SARS-CoV-2 viral copies this assay can detect is 138 copies/mL. A negative result does not preclude SARS-Cov-2 infection and should not be used as the sole basis for treatment or other patient management decisions. A negative result may occur with  improper specimen collection/handling, submission of specimen other than nasopharyngeal  swab, presence of viral mutation(s) within the areas targeted by this assay, and inadequate number of viral copies(<138 copies/mL). A negative result must be combined with clinical observations, patient history, and epidemiological information. The expected result is Negative.  Fact Sheet for Patients:  EntrepreneurPulse.com.au  Fact Sheet for Healthcare Providers:  IncredibleEmployment.be  This test is no t yet approved or cleared by the [place] FDA and  has been authorized for detection and/or diagnosis of SARS-CoV-2 by FDA under an Emergency Use Authorization (EUA).  This EUA will remain  in effect (meaning this test can be used) for the duration of the COVID-19 declaration under Section 564(b)(1) of the Act, 21 U.S.C.section 360bbb-3(b)(1), unless the authorization is terminated  or revoked sooner.       Influenza A by PCR NEGATIVE NEGATIVE Final   Influenza B by PCR NEGATIVE NEGATIVE Final    Comment: (NOTE) The Xpert Xpress SARS-CoV-2/FLU/RSV plus assay is intended as an aid in the diagnosis of influenza from Nasopharyngeal swab specimens and should not be used as a sole basis for treatment. Nasal washings and aspirates are unacceptable for Xpert Xpress SARS-CoV-2/FLU/RSV testing.  Fact Sheet for Patients: EntrepreneurPulse.com.au  Fact Sheet for Healthcare Providers: IncredibleEmployment.be  This test is not yet approved or cleared by the [place] FDA and has been authorized for detection and/or diagnosis of SARS-CoV-2 by FDA under an Emergency Use Authorization (EUA). This EUA will remain in effect (meaning this test can be used) for the duration of the COVID-19 declaration under Section 564(b)(1) of the Act, 21 U.S.C. section 360bbb-3(b)(1), unless the authorization is terminated or revoked.  Performed at [facility], [address], [place], [place] [address]      RADIOLOGY:  No results found.   CODE STATUS:     Code Status Orders  (From admission, onward)           Start     Ordered   06/01/21 0027  Full code  Continuous        06/01/21 0027           Code Status History     This patient has a current code status but no historical code status.        TOTAL TIME TAKING CARE OF THIS PATIENT: 40 minutes.    Fritzi Mandes M.D  [facility]    CC: Primary care physician; [place] Pink, MD

## 2021-06-01 NOTE — Consult Note (Signed)
[facility] Gastroenterology Inpatient Consultation   Patient ID: Carrie Wong is a 80 y.o. female.  Requesting Provider: Fritzi Mandes, MD  Date of Admission: 05/31/2021  Date of Consult: 06/01/21   Reason for Consultation: Hematochezia  Patient's Chief Complaint:   Chief Complaint  Patient presents with   Rectal Bleeding    80 year old Caucasian female with history of DM2, fibromyalgia, GERD, hypertension, hyperlipidemia who presented with hematochezia that started on October 3.  Gastroenterology was consulted for this lower GI bleeding.  Patient has a known history of diverticulosis and had recurrent presentations for bright red blood per rectum.  She underwent a colonoscopy in August 22 demonstrating said diverticuli.  During her hospitalization, her hemoglobin has remained stable above 14 and her vital signs have also remained stable.  She continues to have some blood per rectum.  She said in the past this is usually stopped on its own in a short period time, but this time it lasted for a few days which prompted her presentation to the hospital.  Currently she is without abdominal pain fever chills nausea vomiting melena.  Denies any NSAIDs at this point in time  No other acute GI complaint  Denies NSAIDs, Anti-plt agents, and anticoagulants Denies family history of gastrointestinal disease and malignancy Previous Endoscopies: Colonoscopy August 2022-Dr. Byrnett-diverticulosis  No history EGD  Past Medical History:  Diagnosis Date   Allergy    Arthritis of knee, left    Asthma    Diabetes mellitus without complication (HCC)    Fibromyalgia    Fibromyalgia    GERD (gastroesophageal reflux disease)    Hip arthritis    Hyperlipidemia    Hypertension    Obesity    Plantar fasciitis    Sleep apnea    Tinea    Type 2 diabetes mellitus ([place]) 10/22/2015    Past Surgical History:  Procedure Laterality Date   ABDOMINAL HYSTERECTOMY  01/26/2012   Total vaginal for precancerous  cells (ovaries remain)   BILATERAL CARPAL TUNNEL RELEASE Bilateral    COLONOSCOPY WITH PROPOFOL N/A 03/29/2021   Procedure: COLONOSCOPY WITH PROPOFOL;  Surgeon: Robert Bellow, MD;  Location: [place];  Service: Endoscopy;  Laterality: N/A;  DM   DILATION AND CURETTAGE OF UTERUS     HALLUX VALGUS CORRECTION Left     Allergies  Allergen Reactions   Amlodipine Besylate Swelling    Family History  Problem Relation Age of Onset   Hypertension Mother    Heart disease Mother 66       Bypass surgery   Cancer Father        lung   Heart disease Father 48       bypass surgery   Breast cancer Cousin     Social History   Tobacco Use   Smoking status: Former    Packs/day: 0.50    Years: 10.00    Pack years: 5.00    Types: Cigarettes    Quit date: 12/26/1966    Years since quitting: 54.4   Smokeless tobacco: Never  Vaping Use   Vaping Use: Never used  Substance Use Topics   Alcohol use: No   Drug use: No     Pertinent GI related history and allergies were reviewed with the patient  Review of Systems  Constitutional:  Negative for activity change, appetite change, fatigue, fever and unexpected weight change.  HENT:  Negative for trouble swallowing and voice change.   Respiratory:  Negative for shortness of breath and wheezing.  Cardiovascular:  Negative for chest pain and palpitations.  Gastrointestinal:  Positive for blood in stool and constipation. Negative for abdominal distention, abdominal pain, anal bleeding, diarrhea, nausea, rectal pain and vomiting.  Musculoskeletal:  Negative for arthralgias and myalgias.  Skin:  Negative for color change and pallor.  Neurological:  Negative for dizziness, syncope and weakness.  Psychiatric/Behavioral:  Negative for confusion.   All other systems reviewed and are negative.   Medications Home Medications No current facility-administered medications on file prior to encounter.   Current Outpatient Medications on File Prior  to Encounter  Medication Sig Dispense Refill   albuterol (VENTOLIN HFA) 108 (90 Base) MCG/ACT inhaler 2 puffs q.i.d. p.r.n. short of breath, wheezing, or cough     amoxicillin-clavulanate (AUGMENTIN) 875-125 MG tablet Take 1 tablet by mouth 2 (two) times daily.     aspirin EC 81 MG tablet Take 81 mg by mouth daily.     Azelastine-Fluticasone 137-50 MCG/ACT SUSP Place 1 spray into the nose 2 (two) times daily. 3 Bottle 3   cyanocobalamin 1000 MCG tablet Take 1 tablet by mouth daily.     diphenhydrAMINE (BENADRYL) 25 mg capsule Take 25 mg by mouth daily.     fluticasone (FLONASE) 50 MCG/ACT nasal spray Place 2 sprays into both nostrils daily.     fluticasone (FLONASE) 50 MCG/ACT nasal spray SPRAY 2 SPRAYS INTO BOTH NOSTRILS DAILY     GLUCOSAMINE CHONDROITIN COMPLX PO Take 1 Dose by mouth daily.     metoprolol succinate (TOPROL-XL) 50 MG 24 hr tablet TAKE 1 TABLET MOUTH ONCE DAILY WITH A MEAL 90 tablet 1   montelukast (SINGULAIR) 10 MG tablet Take 1 tablet (10 mg total) by mouth at bedtime. (Patient taking differently: Take 10 mg by mouth daily.) 90 tablet 3   pilocarpine (SALAGEN) 5 MG tablet Take 5 mg by mouth 3 (three) times daily.     pilocarpine (SALAGEN) 5 MG tablet Take 1 tablet by mouth 3 (three) times daily.     simvastatin (ZOCOR) 10 MG tablet TAKE ONE TABLET BY MOUTH EVERY NIGHT AT BEDTIME 30 tablet 0   telmisartan (MICARDIS) 80 MG tablet Take 80 mg by mouth daily.     traMADol (ULTRAM) 50 MG tablet Take by mouth every 6 (six) hours as needed.     vitamin B-12 (CYANOCOBALAMIN) 1000 MCG tablet Take 1,000 mcg by mouth daily.     benzonatate (TESSALON) 100 MG capsule Take 200 mg by mouth 3 (three) times daily as needed for cough. (Patient not taking: Reported on 05/31/2021)     Cinnamon Bark POWD 1 Dose by Does not apply route daily.     clotrimazole-betamethasone (LOTRISONE) cream Apply topically.     furosemide (LASIX) 20 MG tablet Take 1 tablet (20 mg total) by mouth daily for 5 days. 5  tablet 0   ketoconazole (NIZORAL) 2 % cream Apply 1 application topically daily.     potassium chloride SA (KLOR-CON) 20 MEQ tablet Take 1 tablet (20 mEq total) by mouth daily for 5 days. 5 tablet 0   traMADol (ULTRAM) 50 MG tablet Take 1 tablet by mouth every 6 (six) hours as needed.     traZODone (DESYREL) 50 MG tablet TAKE 1/2 TO 1 TABLET BY MOUTH AT [facility] NEEDED FOR SLEEP (Patient not taking: Reported on 05/31/2021) 90 tablet 1   Pertinent GI related medications were reviewed with the patient  Inpatient Medications  Current Facility-Administered Medications:    0.9 %  sodium chloride infusion, , Intravenous, Continuous,  Fritzi Mandes, MD, Last Rate: 75 mL/hr at 06/01/21 0151, New Bag at 06/01/21 0151   acetaminophen (TYLENOL) tablet 650 mg, 650 mg, Oral, Q6H PRN **OR** acetaminophen (TYLENOL) suppository 650 mg, 650 mg, Rectal, Q6H PRN, Fritzi Mandes, MD   benzonatate (TESSALON) capsule 200 mg, 200 mg, Oral, TID PRN, Fritzi Mandes, MD   diphenhydrAMINE (BENADRYL) capsule 25 mg, 25 mg, Oral, Daily, Fritzi Mandes, MD   fluticasone (FLONASE) 50 MCG/ACT nasal spray 2 spray, 2 spray, Each Nare, Daily, Fritzi Mandes, MD   montelukast (SINGULAIR) tablet 10 mg, 10 mg, Oral, Daily, Fritzi Mandes, MD   ondansetron (ZOFRAN) tablet 4 mg, 4 mg, Oral, Q6H PRN **OR** ondansetron (ZOFRAN) injection 4 mg, 4 mg, Intravenous, Q6H PRN, Fritzi Mandes, MD   pilocarpine (SALAGEN) tablet 5 mg, 5 mg, Oral, TID, Fritzi Mandes, MD   simvastatin (ZOCOR) tablet 10 mg, 10 mg, Oral, QHS, Fritzi Mandes, MD, 10 mg at 06/01/21 0151   traMADol (ULTRAM) tablet 50 mg, 50 mg, Oral, Q6H PRN, Fritzi Mandes, MD   traZODone (DESYREL) tablet 50 mg, 50 mg, Oral, QHS PRN, Fritzi Mandes, MD   vitamin B-12 (CYANOCOBALAMIN) tablet 1,000 mcg, 1,000 mcg, Oral, Daily, Fritzi Mandes, MD  Current Outpatient Medications:    albuterol (VENTOLIN HFA) 108 (90 Base) MCG/ACT inhaler, 2 puffs q.i.d. p.r.n. short of breath, wheezing, or cough, Disp: , Rfl:     amoxicillin-clavulanate (AUGMENTIN) 875-125 MG tablet, Take 1 tablet by mouth 2 (two) times daily., Disp: , Rfl:    aspirin EC 81 MG tablet, Take 81 mg by mouth daily., Disp: , Rfl:    Azelastine-Fluticasone 137-50 MCG/ACT SUSP, Place 1 spray into the nose 2 (two) times daily., Disp: 3 Bottle, Rfl: 3   cyanocobalamin 1000 MCG tablet, Take 1 tablet by mouth daily., Disp: , Rfl:    diphenhydrAMINE (BENADRYL) 25 mg capsule, Take 25 mg by mouth daily., Disp: , Rfl:    fluticasone (FLONASE) 50 MCG/ACT nasal spray, Place 2 sprays into both nostrils daily., Disp: , Rfl:    fluticasone (FLONASE) 50 MCG/ACT nasal spray, SPRAY 2 SPRAYS INTO BOTH NOSTRILS DAILY, Disp: , Rfl:    GLUCOSAMINE CHONDROITIN COMPLX PO, Take 1 Dose by mouth daily., Disp: , Rfl:    metoprolol succinate (TOPROL-XL) 50 MG 24 hr tablet, TAKE 1 TABLET MOUTH ONCE DAILY WITH A MEAL, Disp: 90 tablet, Rfl: 1   montelukast (SINGULAIR) 10 MG tablet, Take 1 tablet (10 mg total) by mouth at bedtime. (Patient taking differently: Take 10 mg by mouth daily.), Disp: 90 tablet, Rfl: 3   pilocarpine (SALAGEN) 5 MG tablet, Take 5 mg by mouth 3 (three) times daily., Disp: , Rfl:    pilocarpine (SALAGEN) 5 MG tablet, Take 1 tablet by mouth 3 (three) times daily., Disp: , Rfl:    simvastatin (ZOCOR) 10 MG tablet, TAKE ONE TABLET BY MOUTH EVERY NIGHT AT BEDTIME, Disp: 30 tablet, Rfl: 0   telmisartan (MICARDIS) 80 MG tablet, Take 80 mg by mouth daily., Disp: , Rfl:    traMADol (ULTRAM) 50 MG tablet, Take by mouth every 6 (six) hours as needed., Disp: , Rfl:    vitamin B-12 (CYANOCOBALAMIN) 1000 MCG tablet, Take 1,000 mcg by mouth daily., Disp: , Rfl:    benzonatate (TESSALON) 100 MG capsule, Take 200 mg by mouth 3 (three) times daily as needed for cough. (Patient not taking: Reported on 05/31/2021), Disp: , Rfl:    Cinnamon Bark POWD, 1 Dose by Does not apply route daily., Disp: , Rfl:  clotrimazole-betamethasone (LOTRISONE) cream, Apply topically., Disp: ,  Rfl:    furosemide (LASIX) 20 MG tablet, Take 1 tablet (20 mg total) by mouth daily for 5 days., Disp: 5 tablet, Rfl: 0   ketoconazole (NIZORAL) 2 % cream, Apply 1 application topically daily., Disp: , Rfl:    potassium chloride SA (KLOR-CON) 20 MEQ tablet, Take 1 tablet (20 mEq total) by mouth daily for 5 days., Disp: 5 tablet, Rfl: 0   traMADol (ULTRAM) 50 MG tablet, Take 1 tablet by mouth every 6 (six) hours as needed., Disp: , Rfl:    traZODone (DESYREL) 50 MG tablet, TAKE 1/2 TO 1 TABLET BY MOUTH AT [facility] NEEDED FOR SLEEP (Patient not taking: Reported on 05/31/2021), Disp: 90 tablet, Rfl: 1  sodium chloride 75 mL/hr at 06/01/21 0151    acetaminophen **OR** acetaminophen, benzonatate, ondansetron **OR** ondansetron (ZOFRAN) IV, traMADol, traZODone   Objective   Vitals:   05/31/21 2139 06/01/21 0000 06/01/21 0221 06/01/21 0600  BP: (!) 143/56 (!) 151/63 (!) 154/66 123/80  Pulse: (!) 54 62 (!) 59 (!) 54  Resp: 14 15 14 16   Temp:   98.4 F (36.9 C)   TempSrc:   Oral   SpO2: 100% 100% 99% 96%  Weight:      Height:         Physical Exam Vitals reviewed.  Constitutional:      General: She is not in acute distress.    Appearance: Normal appearance. She is obese. She is not ill-appearing, toxic-appearing or diaphoretic.  HENT:     Head: Normocephalic and atraumatic.     Nose: Nose normal.     Mouth/Throat:     Mouth: Mucous membranes are moist.     Pharynx: Oropharynx is clear.  Eyes:     General: No scleral icterus.    Extraocular Movements: Extraocular movements intact.  Cardiovascular:     Rate and Rhythm: Normal rate and regular rhythm.     Heart sounds: Normal heart sounds. No murmur heard.   No friction rub. No gallop.  Pulmonary:     Effort: Pulmonary effort is normal. No respiratory distress.     Breath sounds: Normal breath sounds. No wheezing, rhonchi or rales.  Abdominal:     General: Abdomen is flat. Bowel sounds are normal. There is no distension.      Palpations: Abdomen is soft.     Tenderness: There is no abdominal tenderness. There is no guarding or rebound.  Musculoskeletal:     Cervical back: Neck supple.     Right lower leg: No edema.     Left lower leg: No edema.  Skin:    General: Skin is warm and dry.     Coloration: Skin is not jaundiced or pale.  Neurological:     General: No focal deficit present.     Mental Status: She is alert and oriented to person, place, and time. Mental status is at baseline.  Psychiatric:        Mood and Affect: Mood normal.        Behavior: Behavior normal.        Thought Content: Thought content normal.        Judgment: Judgment normal.    Laboratory Data Recent Labs  Lab 05/31/21 1458 05/31/21 1734 06/01/21 0152  WBC 9.4  --   --   HGB 14.9 14.4 14.9  HCT 45.0  --   --   PLT 202  --   --    Recent  Labs  Lab 05/31/21 1458  NA 133*  K 4.4  CL 101  CO2 27  BUN 11  CALCIUM 8.6*  PROT 6.4*  BILITOT 0.9  ALKPHOS 53  ALT 19  AST 17  GLUCOSE 164*   No results for input(s): INR in the last 168 hours.  No results for input(s): LIPASE in the last 72 hours.     Imaging Studies: No results found.  Assessment:   # hematochezia - suspected diverticular bleed - hgb and vital signs stable # diverticulosis - colonscopy in 03/2021 # Jerrye Bushy # obesity  Plan:  Hgb stable on multiple checks (currently level >14),  Vital signs stable Recent colonoscopy 03/2021 with diverticulosis which is likely source of bleed No GI endoscopic intervention indicated at this time OK to eat from GI perspective Recommend surgery involvement, outpatient is acceptable for potential resection as this bleeding has occurred multiple times. Patient can discuss with her established surgeon Plan discussed with patient who is amenable  I personally performed the service.  Management of other medical comorbidities as per primary team  Thank you for allowing [place] to participate in this patient's care. Please  don't hesitate to call if any questions or concerns arise.   Annamaria Helling, DO [facility] Gastroenterology  Portions of the record may have been created with voice recognition software. Occasional wrong-word or 'sound-a-like' substitutions may have occurred due to the inherent limitations of voice recognition software.  Read the chart carefully and recognize, using context, where substitutions may have occurred.

## 2021-06-01 NOTE — Progress Notes (Signed)
   06/01/21 1230  Clinical Encounter Type  Visited With Patient  Visit Type Initial;Spiritual support;Social support  Referral From Nurse  Consult/Referral To Chaplain   Chaplain completed AD education with PT. Nurse will contact chaplain when the PT is ready for it to be notarized.

## 2021-06-15 DIAGNOSIS — G4733 Obstructive sleep apnea (adult) (pediatric): Secondary | ICD-10-CM | POA: Diagnosis not present

## 2021-06-20 DIAGNOSIS — K5730 Diverticulosis of large intestine without perforation or abscess without bleeding: Secondary | ICD-10-CM | POA: Diagnosis not present

## 2021-06-20 DIAGNOSIS — E785 Hyperlipidemia, unspecified: Secondary | ICD-10-CM | POA: Diagnosis not present

## 2021-06-20 DIAGNOSIS — R8290 Unspecified abnormal findings in urine: Secondary | ICD-10-CM | POA: Diagnosis not present

## 2021-06-20 DIAGNOSIS — Z0000 Encounter for general adult medical examination without abnormal findings: Secondary | ICD-10-CM | POA: Diagnosis not present

## 2021-06-20 DIAGNOSIS — E119 Type 2 diabetes mellitus without complications: Secondary | ICD-10-CM | POA: Diagnosis not present

## 2021-06-20 DIAGNOSIS — I10 Essential (primary) hypertension: Secondary | ICD-10-CM | POA: Diagnosis not present

## 2021-06-20 DIAGNOSIS — R600 Localized edema: Secondary | ICD-10-CM | POA: Diagnosis not present

## 2021-06-20 DIAGNOSIS — M3500 Sicca syndrome, unspecified: Secondary | ICD-10-CM | POA: Diagnosis not present

## 2021-06-21 DIAGNOSIS — R8290 Unspecified abnormal findings in urine: Secondary | ICD-10-CM | POA: Diagnosis not present

## 2021-09-14 DIAGNOSIS — R682 Dry mouth, unspecified: Secondary | ICD-10-CM | POA: Diagnosis not present

## 2021-09-14 DIAGNOSIS — M19041 Primary osteoarthritis, right hand: Secondary | ICD-10-CM | POA: Diagnosis not present

## 2021-09-14 DIAGNOSIS — M064 Inflammatory polyarthropathy: Secondary | ICD-10-CM | POA: Diagnosis not present

## 2021-09-14 DIAGNOSIS — M19042 Primary osteoarthritis, left hand: Secondary | ICD-10-CM | POA: Diagnosis not present

## 2021-09-14 DIAGNOSIS — R21 Rash and other nonspecific skin eruption: Secondary | ICD-10-CM | POA: Diagnosis not present

## 2021-09-14 DIAGNOSIS — R768 Other specified abnormal immunological findings in serum: Secondary | ICD-10-CM | POA: Diagnosis not present

## 2021-09-14 DIAGNOSIS — H04123 Dry eye syndrome of bilateral lacrimal glands: Secondary | ICD-10-CM | POA: Diagnosis not present

## 2021-09-14 DIAGNOSIS — M797 Fibromyalgia: Secondary | ICD-10-CM | POA: Diagnosis not present

## 2021-09-14 DIAGNOSIS — Z8719 Personal history of other diseases of the digestive system: Secondary | ICD-10-CM | POA: Diagnosis not present

## 2021-09-18 DIAGNOSIS — B353 Tinea pedis: Secondary | ICD-10-CM | POA: Diagnosis not present

## 2021-09-18 DIAGNOSIS — L821 Other seborrheic keratosis: Secondary | ICD-10-CM | POA: Diagnosis not present

## 2021-09-18 DIAGNOSIS — M71342 Other bursal cyst, left hand: Secondary | ICD-10-CM | POA: Diagnosis not present

## 2021-09-28 DIAGNOSIS — G4733 Obstructive sleep apnea (adult) (pediatric): Secondary | ICD-10-CM | POA: Diagnosis not present

## 2021-11-15 DIAGNOSIS — M3505 Sjogren syndrome with inflammatory arthritis: Secondary | ICD-10-CM | POA: Diagnosis not present

## 2021-12-19 DIAGNOSIS — Z6841 Body Mass Index (BMI) 40.0 and over, adult: Secondary | ICD-10-CM | POA: Diagnosis not present

## 2021-12-19 DIAGNOSIS — M3500 Sicca syndrome, unspecified: Secondary | ICD-10-CM | POA: Diagnosis not present

## 2021-12-19 DIAGNOSIS — M79631 Pain in right forearm: Secondary | ICD-10-CM | POA: Diagnosis not present

## 2021-12-19 DIAGNOSIS — R140 Abdominal distension (gaseous): Secondary | ICD-10-CM | POA: Diagnosis not present

## 2021-12-19 DIAGNOSIS — M1990 Unspecified osteoarthritis, unspecified site: Secondary | ICD-10-CM | POA: Diagnosis not present

## 2021-12-19 DIAGNOSIS — E119 Type 2 diabetes mellitus without complications: Secondary | ICD-10-CM | POA: Diagnosis not present

## 2021-12-31 IMAGING — MR MR HEAD W/O CM
9 of 11 series · 33 of 48 positions shown · non-contrast
Comparison: None.

CLINICAL DATA: Cognitive impairment

EXAM:
MRI HEAD WITHOUT CONTRAST
TECHNIQUE: Multiplanar, multiecho pulse sequences of the brain and surrounding
structures were obtained without intravenous contrast.
Additionally, using NeuroQuant software a 3D volumetric analysis of
the brain was performed and is compared to a normative database
adjusted for age, gender and intracranial volume.

[Series 2: DWI · axial · 3.0mm · 0.94mm/px · z∈[-81,+72]mm · 7 of 104 slices shown (1 of 2)]
[im 1/104]
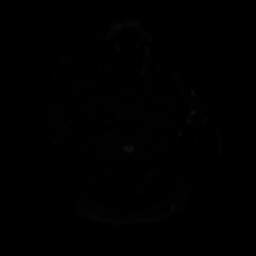
[im 18/104]
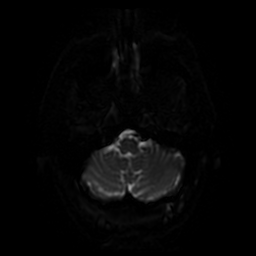
[im 35/104]
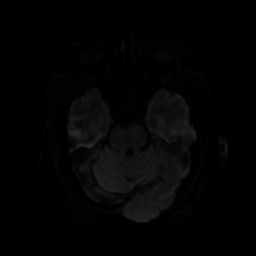
[im 52/104]
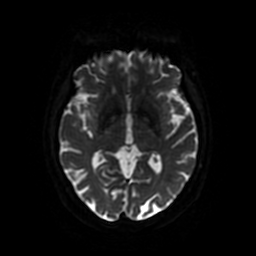
[im 69/104]
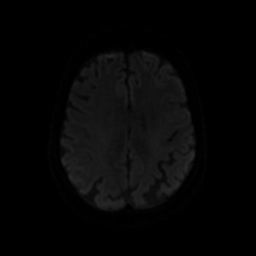
[im 86/104]
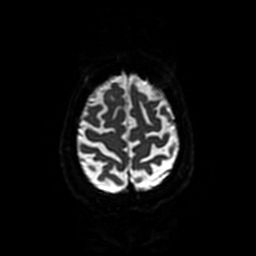
[im 104/104]
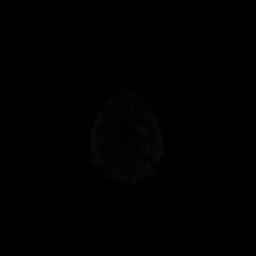

[Series 4: FLAIR · axial · 3.0mm · 0.45mm/px · z∈[-79,+71]mm · 2 of 26 slices shown (1 of 2)]
[im 1/26]
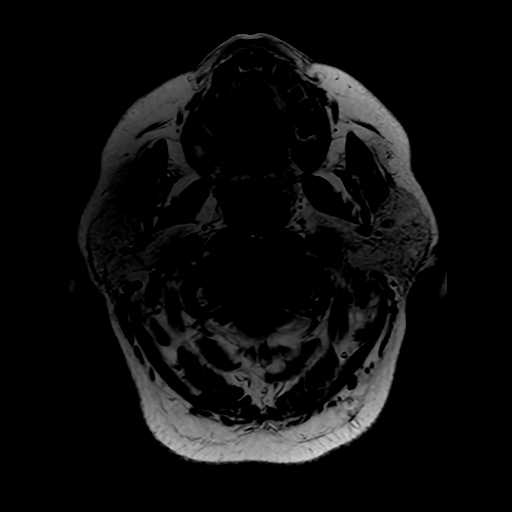
[im 26/26]
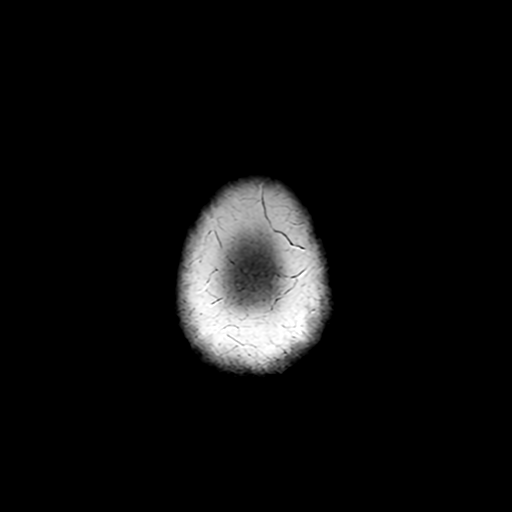

[Series 5: (person_name) · axial · 3.0mm · 0.47mm/px · z∈[-81,+6]mm · 5 of 104 slices shown]
[im 1/104]
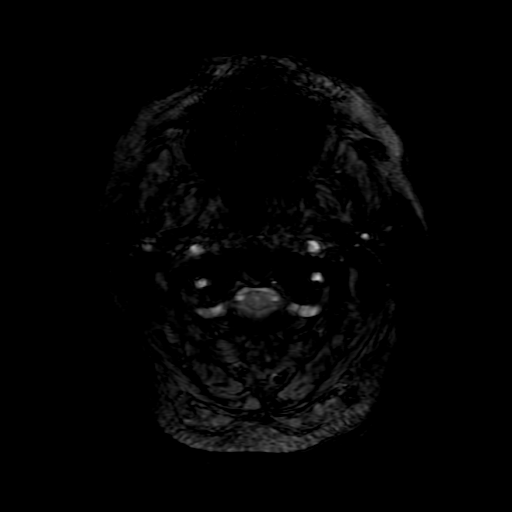
[im 15/104]
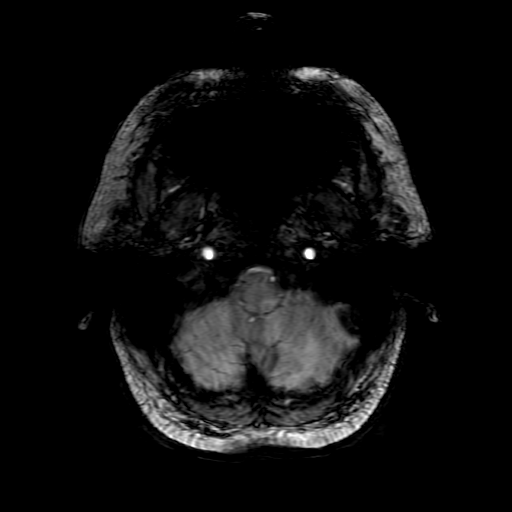
[im 30/104]
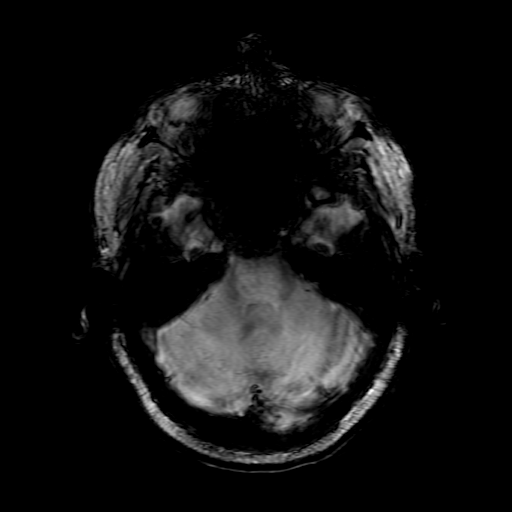
[im 45/104]
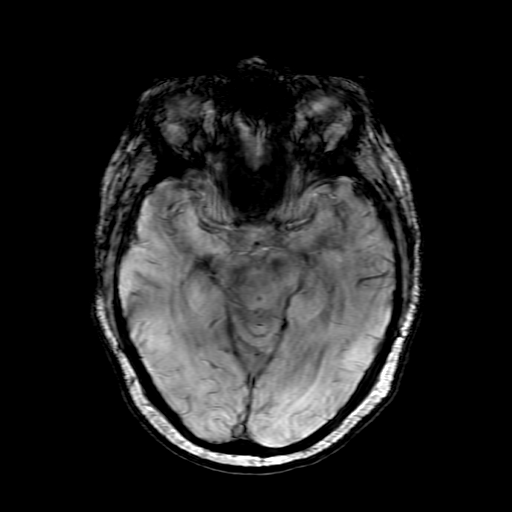
[im 59/104]
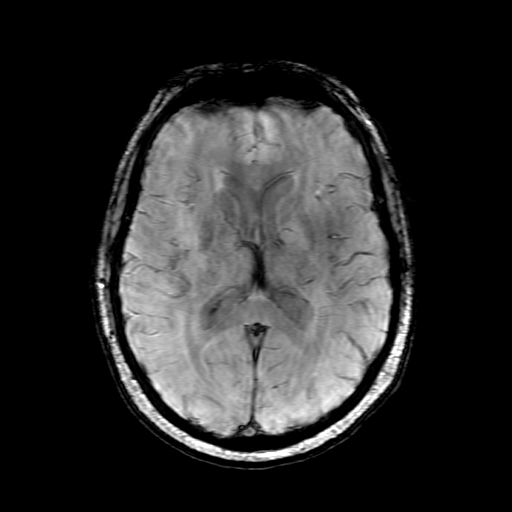

[Series 6: DWI · coronal · 4.0mm · 0.94mm/px · 6 of 74 slices shown (2 of 2)]
[im 1/74]
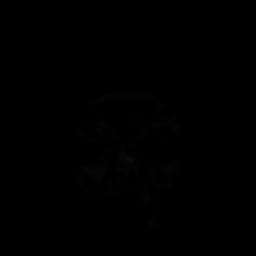
[im 15/74]
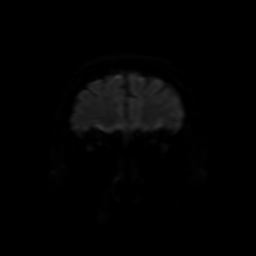
[im 30/74]
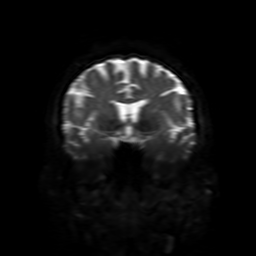
[im 44/74]
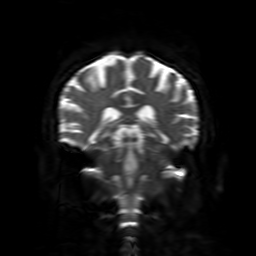
[im 59/74]
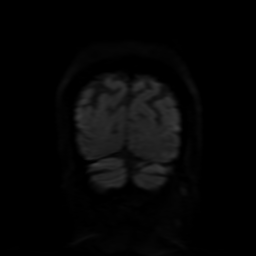
[im 74/74]
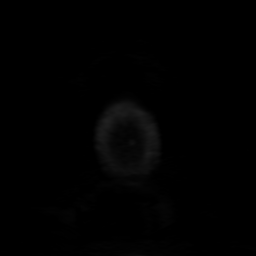

[Series 7: FLAIR · sagittal · 5.0mm · 0.47mm/px · 2 of 25 slices shown (2 of 2)]
[im 1/25]
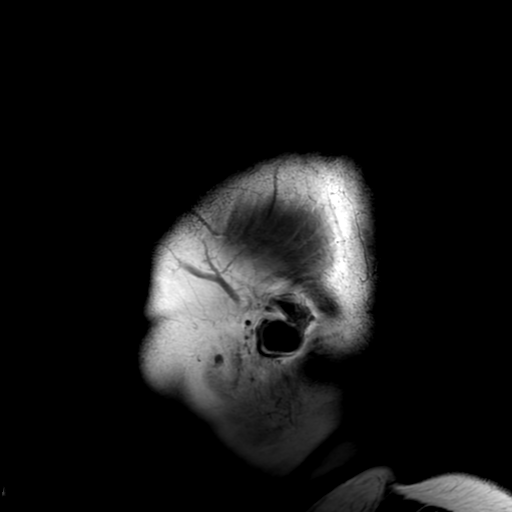
[im 25/25]
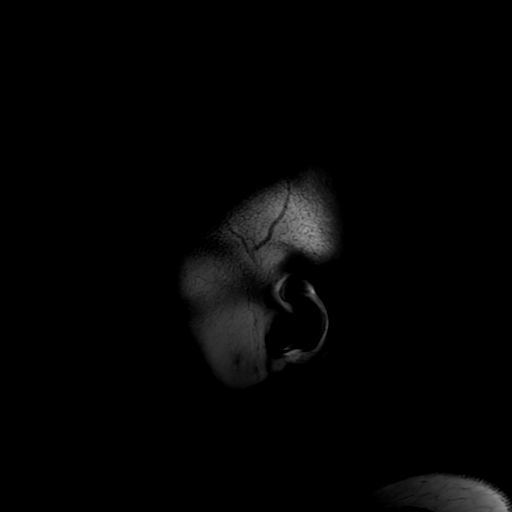

[Series 8: T2 · axial · 5.0mm · 0.47mm/px · z∈[-80,+70]mm · 2 of 26 slices shown (1 of 2)]
[im 1/26]
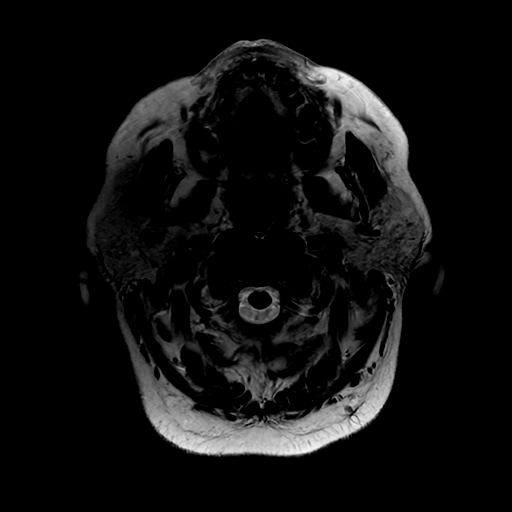
[im 26/26]
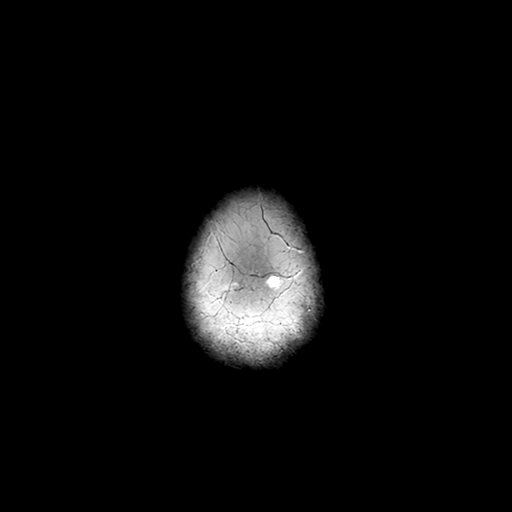

[Series 10: T2 · coronal · 5.0mm · 0.43mm/px · 2 of 31 slices shown (2 of 2)]
[im 1/31]
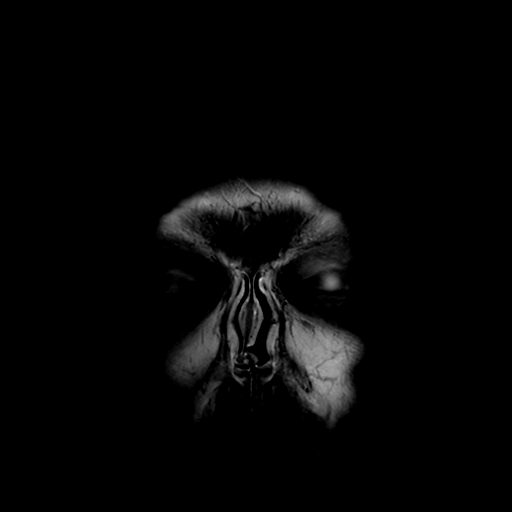
[im 31/31]
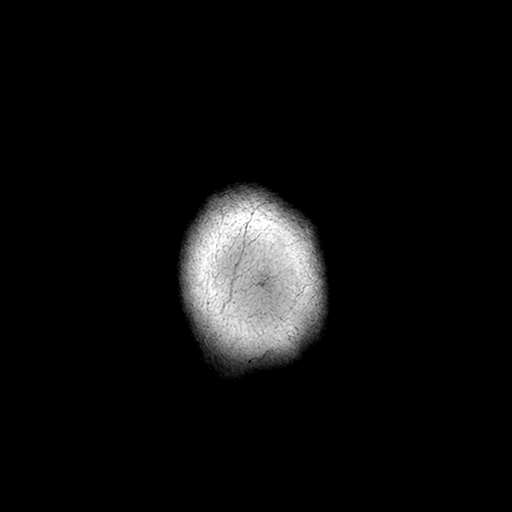

[Series 250: ADC · axial · 3.0mm · 0.94mm/px · z∈[-81,+72]mm · 4 of 52 slices shown (1 of 2)]
[im 1/52]
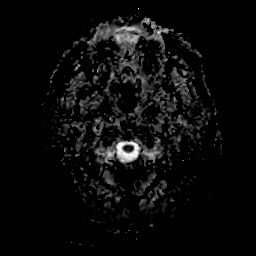
[im 18/52]
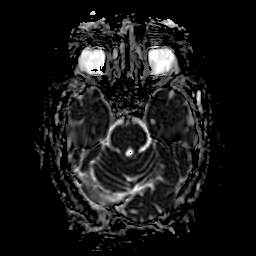
[im 35/52]
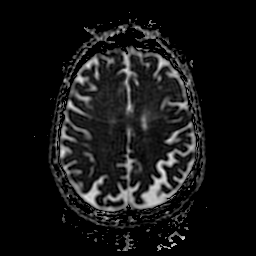
[im 52/52]
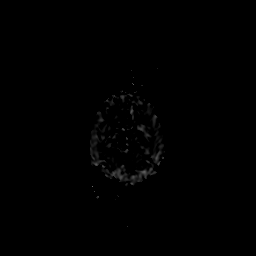

[Series 650: ADC · coronal · 4.0mm · 0.94mm/px · 3 of 37 slices shown (2 of 2)]
[im 1/37]
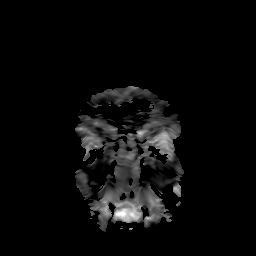
[im 19/37]
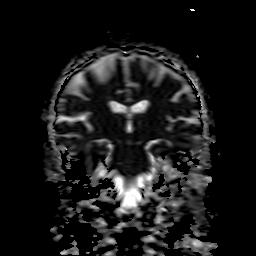
[im 37/37]
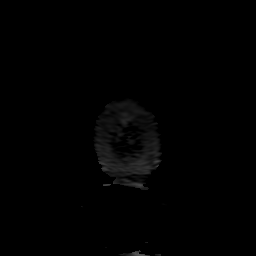

[33 of 48 positions shown; findings below may reference images not displayed]

FINDINGS: Brain: No acute infarct, acute hemorrhage or extra-axial collection.
Multifocal hyperintense T2-weighted signal within the white matter.
Normal volume of CSF spaces. No chronic microhemorrhage. Normal
midline structures.

Vascular: Normal flow voids.

Skull and upper cervical spine: Normal marrow signal.

Sinuses/Orbits: Negative.

Other: None.

NeuroQuant Findings:

Volumetric analysis of the brain was performed, with a fully
detailed report in [HOSPITAL] PACS. Briefly, the comparison with age and
gender matched reference reveals expected values.
IMPRESSION: 1. No acute intracranial abnormality.
2. Multifocal hyperintense T2-weighted signal within the white
matter, nonspecific but most commonly seen in the setting of chronic
microvascular ischemia.
3. NeuroQuant volumetric analysis of the brain, see details on
[HOSPITAL] PACS.

## 2022-02-20 DIAGNOSIS — Z79899 Other long term (current) drug therapy: Secondary | ICD-10-CM | POA: Diagnosis not present

## 2022-02-20 DIAGNOSIS — M3505 Sjogren syndrome with inflammatory arthritis: Secondary | ICD-10-CM | POA: Diagnosis not present

## 2022-03-07 DIAGNOSIS — G4733 Obstructive sleep apnea (adult) (pediatric): Secondary | ICD-10-CM | POA: Diagnosis not present

## 2022-03-09 DIAGNOSIS — H903 Sensorineural hearing loss, bilateral: Secondary | ICD-10-CM | POA: Diagnosis not present

## 2022-03-09 DIAGNOSIS — H9313 Tinnitus, bilateral: Secondary | ICD-10-CM | POA: Diagnosis not present

## 2022-03-15 DIAGNOSIS — H2513 Age-related nuclear cataract, bilateral: Secondary | ICD-10-CM | POA: Diagnosis not present

## 2022-03-15 DIAGNOSIS — E119 Type 2 diabetes mellitus without complications: Secondary | ICD-10-CM | POA: Diagnosis not present

## 2022-03-15 DIAGNOSIS — M069 Rheumatoid arthritis, unspecified: Secondary | ICD-10-CM | POA: Diagnosis not present

## 2022-03-15 DIAGNOSIS — Z79899 Other long term (current) drug therapy: Secondary | ICD-10-CM | POA: Diagnosis not present

## 2022-04-23 DIAGNOSIS — F4381 Prolonged grief disorder: Secondary | ICD-10-CM | POA: Diagnosis not present

## 2022-05-02 DIAGNOSIS — R509 Fever, unspecified: Secondary | ICD-10-CM | POA: Diagnosis not present

## 2022-05-02 DIAGNOSIS — J029 Acute pharyngitis, unspecified: Secondary | ICD-10-CM | POA: Diagnosis not present

## 2022-05-03 DIAGNOSIS — F4381 Prolonged grief disorder: Secondary | ICD-10-CM | POA: Diagnosis not present

## 2022-05-16 DIAGNOSIS — F4381 Prolonged grief disorder: Secondary | ICD-10-CM | POA: Diagnosis not present

## 2022-05-23 DIAGNOSIS — F4381 Prolonged grief disorder: Secondary | ICD-10-CM | POA: Diagnosis not present

## 2022-05-30 DIAGNOSIS — F4381 Prolonged grief disorder: Secondary | ICD-10-CM | POA: Diagnosis not present

## 2022-06-08 DIAGNOSIS — G4733 Obstructive sleep apnea (adult) (pediatric): Secondary | ICD-10-CM | POA: Diagnosis not present

## 2022-06-15 DIAGNOSIS — F4381 Prolonged grief disorder: Secondary | ICD-10-CM | POA: Diagnosis not present

## 2022-06-26 DIAGNOSIS — M3505 Sjogren syndrome with inflammatory arthritis: Secondary | ICD-10-CM | POA: Diagnosis not present

## 2022-06-26 DIAGNOSIS — M797 Fibromyalgia: Secondary | ICD-10-CM | POA: Diagnosis not present

## 2022-06-26 DIAGNOSIS — Z79899 Other long term (current) drug therapy: Secondary | ICD-10-CM | POA: Diagnosis not present

## 2022-06-27 DIAGNOSIS — F4381 Prolonged grief disorder: Secondary | ICD-10-CM | POA: Diagnosis not present

## 2022-06-28 DIAGNOSIS — H2513 Age-related nuclear cataract, bilateral: Secondary | ICD-10-CM | POA: Diagnosis not present

## 2022-07-05 DIAGNOSIS — H2512 Age-related nuclear cataract, left eye: Secondary | ICD-10-CM | POA: Diagnosis not present

## 2022-07-10 DIAGNOSIS — J309 Allergic rhinitis, unspecified: Secondary | ICD-10-CM | POA: Diagnosis not present

## 2022-07-10 DIAGNOSIS — E785 Hyperlipidemia, unspecified: Secondary | ICD-10-CM | POA: Diagnosis not present

## 2022-07-10 DIAGNOSIS — E1136 Type 2 diabetes mellitus with diabetic cataract: Secondary | ICD-10-CM | POA: Diagnosis not present

## 2022-07-10 DIAGNOSIS — H9193 Unspecified hearing loss, bilateral: Secondary | ICD-10-CM | POA: Diagnosis not present

## 2022-07-10 DIAGNOSIS — M3500 Sicca syndrome, unspecified: Secondary | ICD-10-CM | POA: Diagnosis not present

## 2022-07-10 DIAGNOSIS — G8929 Other chronic pain: Secondary | ICD-10-CM | POA: Diagnosis not present

## 2022-07-10 DIAGNOSIS — E1142 Type 2 diabetes mellitus with diabetic polyneuropathy: Secondary | ICD-10-CM | POA: Diagnosis not present

## 2022-07-10 DIAGNOSIS — E1169 Type 2 diabetes mellitus with other specified complication: Secondary | ICD-10-CM | POA: Diagnosis not present

## 2022-07-10 DIAGNOSIS — M069 Rheumatoid arthritis, unspecified: Secondary | ICD-10-CM | POA: Diagnosis not present

## 2022-07-10 DIAGNOSIS — I10 Essential (primary) hypertension: Secondary | ICD-10-CM | POA: Diagnosis not present

## 2022-07-10 DIAGNOSIS — G4733 Obstructive sleep apnea (adult) (pediatric): Secondary | ICD-10-CM | POA: Diagnosis not present

## 2022-07-11 ENCOUNTER — Encounter: Payer: Self-pay | Admitting: Ophthalmology

## 2022-07-11 DIAGNOSIS — F4381 Prolonged grief disorder: Secondary | ICD-10-CM | POA: Diagnosis not present

## 2022-07-12 NOTE — Discharge Instructions (Signed)

## 2022-07-16 ENCOUNTER — Ambulatory Visit: Payer: PPO | Admitting: Anesthesiology

## 2022-07-16 ENCOUNTER — Other Ambulatory Visit: Payer: Self-pay

## 2022-07-16 ENCOUNTER — Encounter
Admission: RE | Disposition: A | Discharge status: Home / Self Care | Payer: Self-pay | Source: Home / Self Care | Attending: Ophthalmology

## 2022-07-16 ENCOUNTER — Ambulatory Visit
Admission: RE | Admit: 2022-07-16 | Discharge: 2022-07-16 | Disposition: A | Discharge status: Home / Self Care | Payer: PPO | Attending: Ophthalmology | Admitting: Ophthalmology

## 2022-07-16 ENCOUNTER — Encounter: Payer: Self-pay | Admitting: Ophthalmology

## 2022-07-16 DIAGNOSIS — Z79899 Other long term (current) drug therapy: Secondary | ICD-10-CM | POA: Diagnosis not present

## 2022-07-16 DIAGNOSIS — Z6835 Body mass index (BMI) 35.0-35.9, adult: Secondary | ICD-10-CM | POA: Insufficient documentation

## 2022-07-16 DIAGNOSIS — J45909 Unspecified asthma, uncomplicated: Secondary | ICD-10-CM | POA: Diagnosis not present

## 2022-07-16 DIAGNOSIS — G4730 Sleep apnea, unspecified: Secondary | ICD-10-CM | POA: Insufficient documentation

## 2022-07-16 DIAGNOSIS — Z87891 Personal history of nicotine dependence: Secondary | ICD-10-CM | POA: Diagnosis not present

## 2022-07-16 DIAGNOSIS — I10 Essential (primary) hypertension: Secondary | ICD-10-CM | POA: Insufficient documentation

## 2022-07-16 DIAGNOSIS — H2512 Age-related nuclear cataract, left eye: Secondary | ICD-10-CM | POA: Insufficient documentation

## 2022-07-16 DIAGNOSIS — E1136 Type 2 diabetes mellitus with diabetic cataract: Secondary | ICD-10-CM | POA: Insufficient documentation

## 2022-07-16 DIAGNOSIS — H269 Unspecified cataract: Secondary | ICD-10-CM | POA: Diagnosis not present

## 2022-07-16 DIAGNOSIS — E669 Obesity, unspecified: Secondary | ICD-10-CM | POA: Diagnosis not present

## 2022-07-16 DIAGNOSIS — E785 Hyperlipidemia, unspecified: Secondary | ICD-10-CM | POA: Diagnosis not present

## 2022-07-16 HISTORY — DX: Presence of external hearing-aid: Z97.4

## 2022-07-16 HISTORY — PX: CATARACT EXTRACTION W/PHACO: SHX586

## 2022-07-16 SURGERY — PHACOEMULSIFICATION, CATARACT, WITH IOL INSERTION
Anesthesia: Monitor Anesthesia Care | Site: Eye | Laterality: Left

## 2022-07-16 MED ORDER — SIGHTPATH DOSE#1 NA HYALUR & NA CHOND-NA HYALUR IO KIT
PACK | INTRAOCULAR | Status: DC | PRN
  Administered 2022-07-16: 1 via OPHTHALMIC

## 2022-07-16 MED ORDER — TETRACAINE HCL 0.5 % OP SOLN
1.0000 [drp] | OPHTHALMIC | Status: DC | PRN
  Administered 2022-07-16 (×3): 1 [drp] via OPHTHALMIC

## 2022-07-16 MED ORDER — LIDOCAINE HCL (PF) 2 % IJ SOLN
INTRAOCULAR | Status: DC | PRN
  Administered 2022-07-16: 1 mL via INTRAOCULAR

## 2022-07-16 MED ORDER — MOXIFLOXACIN HCL 0.5 % OP SOLN
OPHTHALMIC | Status: DC | PRN
  Administered 2022-07-16: .2 mL via OPHTHALMIC

## 2022-07-16 MED ORDER — ARMC OPHTHALMIC DILATING DROPS
1.0000 | OPHTHALMIC | Status: DC | PRN
  Administered 2022-07-16 (×3): 1 via OPHTHALMIC

## 2022-07-16 MED ORDER — SIGHTPATH DOSE#1 BSS IO SOLN
INTRAOCULAR | Status: DC | PRN
  Administered 2022-07-16: 15 mL

## 2022-07-16 MED ORDER — FENTANYL CITRATE (PF) 100 MCG/2ML IJ SOLN
INTRAMUSCULAR | Status: DC | PRN
  Administered 2022-07-16: 50 ug via INTRAVENOUS

## 2022-07-16 MED ORDER — MIDAZOLAM HCL 2 MG/2ML IJ SOLN
INTRAMUSCULAR | Status: DC | PRN
  Administered 2022-07-16: 1 mg via INTRAVENOUS

## 2022-07-16 MED ORDER — SIGHTPATH DOSE#1 BSS IO SOLN
INTRAOCULAR | Status: DC | PRN
  Administered 2022-07-16: 88 mL via OPHTHALMIC

## 2022-07-16 SURGICAL SUPPLY — 13 items
CATARACT SUITE SIGHTPATH (MISCELLANEOUS) ×1 IMPLANT
DISSECTOR HYDRO NUCLEUS 50X22 (MISCELLANEOUS) ×1 IMPLANT
FEE CATARACT SUITE SIGHTPATH (MISCELLANEOUS) ×1 IMPLANT
GLOVE SURG GAMMEX PI TX LF 7.5 (GLOVE) ×1 IMPLANT
GLOVE SURG SYN 8.5  E (GLOVE) ×1
GLOVE SURG SYN 8.5 E (GLOVE) ×1 IMPLANT
GLOVE SURG SYN 8.5 PF PI (GLOVE) ×1 IMPLANT
LENS IOL TECNIS EYHANCE 21.5 (Intraocular Lens) IMPLANT
NDL FILTER BLUNT 18X1 1/2 (NEEDLE) ×1 IMPLANT
NEEDLE FILTER BLUNT 18X1 1/2 (NEEDLE) ×1 IMPLANT
SYR 3ML LL SCALE MARK (SYRINGE) ×1 IMPLANT
SYR 5ML LL (SYRINGE) ×1 IMPLANT
WATER STERILE IRR 250ML POUR (IV SOLUTION) ×1 IMPLANT

## 2022-07-16 NOTE — Transfer of Care (Signed)
Immediate Anesthesia Transfer of Care Note  Patient: Carrie Wong  Procedure(s) Performed: CATARACT EXTRACTION PHACO AND INTRAOCULAR LENS PLACEMENT (IOC) LEFT DIABETIC (Left: Eye)  Patient Location: PACU  Anesthesia Type: MAC  Level of Consciousness: awake, alert  and patient cooperative  Airway and Oxygen Therapy: Patient Spontanous Breathing and Patient connected to supplemental oxygen  Post-op Assessment: Post-op Vital signs reviewed, Patient's Cardiovascular Status Stable, Respiratory Function Stable, Patent Airway and No signs of Nausea or vomiting  Post-op Vital Signs: Reviewed and stable  Complications: There were no known notable events for this encounter.

## 2022-07-16 NOTE — Op Note (Signed)
OPERATIVE NOTE  Carrie Wong [id number] 07/16/2022   PREOPERATIVE DIAGNOSIS:  Nuclear sclerotic cataract left eye.  H25.12   POSTOPERATIVE DIAGNOSIS:    Nuclear sclerotic cataract left eye.     PROCEDURE:  Phacoemusification with posterior chamber intraocular lens placement of the left eye   LENS:   Implant Name Type Inv. Item Serial No. Manufacturer Lot No. LRB No. Used Action  LENS IOL TECNIS EYHANCE 21.5 - [id number] Intraocular Lens LENS IOL TECNIS EYHANCE 21.5 [id number] [employer]  Left 1 Implanted      Procedure(s) with comments: CATARACT EXTRACTION PHACO AND INTRAOCULAR LENS PLACEMENT (IOC) LEFT DIABETIC (Left) - 3.55 00:30.3   SURGEON:  Benay Pillow, MD, MPH   ANESTHESIA:  Topical with tetracaine drops augmented with 1% preservative-free intracameral lidocaine.  ESTIMATED BLOOD LOSS: <1 mL   COMPLICATIONS:  None.   DESCRIPTION OF PROCEDURE:  The patient was identified in the holding room and transported to the operating room and placed in the supine position under the operating microscope.  The left eye was identified as the operative eye and it was prepped and draped in the usual sterile ophthalmic fashion.   A 1.0 millimeter clear-corneal paracentesis was made at the 5:00 position. 0.5 ml of preservative-free 1% lidocaine with epinephrine was injected into the anterior chamber.  The anterior chamber was filled with viscoelastic.  A 2.4 millimeter keratome was used to make a near-clear corneal incision at the 2:00 position.  A curvilinear capsulorrhexis was made with a cystotome and capsulorrhexis forceps.  Balanced salt solution was used to hydrodissect and hydrodelineate the nucleus.   Phacoemulsification was then used in stop and chop fashion to remove the lens nucleus and epinucleus.  The remaining cortex was then removed using the irrigation and aspiration handpiece. Viscoelastic was then placed into the capsular bag to distend it for lens placement.  A lens was then  injected into the capsular bag.  The remaining viscoelastic was aspirated.   Wounds were hydrated with balanced salt solution.  The anterior chamber was inflated to a physiologic pressure with balanced salt solution.  Intracameral vigamox 0.1 mL undiltued was injected into the eye and a drop placed onto the ocular surface.  No wound leaks were noted.  The patient was taken to the recovery room in stable condition without complications of anesthesia or surgery  Benay Pillow 07/16/2022, 9:53 AM

## 2022-07-16 NOTE — H&P (Signed)
[facility]   Primary Care Physician:  Maryland Pink, MD Ophthalmologist: Dr. Benay Pillow  Pre-Procedure History & Physical: HPI:  Carrie Wong is a 81 y.o. female here for cataract surgery.   Past Medical History:  Diagnosis Date   Allergy    Arthritis of knee, left    Asthma    Diabetes mellitus without complication (HCC)    Fibromyalgia    Fibromyalgia    GERD (gastroesophageal reflux disease)    Hip arthritis    Hyperlipidemia    Hypertension    Obesity    Plantar fasciitis    Sleep apnea    Tinea    Type 2 diabetes mellitus ([place]) 10/22/2015   Wears hearing aid in both ears     Past Surgical History:  Procedure Laterality Date   ABDOMINAL HYSTERECTOMY  01/26/2012   Total vaginal for precancerous cells (ovaries remain)   BILATERAL CARPAL TUNNEL RELEASE Bilateral    COLONOSCOPY WITH PROPOFOL N/A 03/29/2021   Procedure: COLONOSCOPY WITH PROPOFOL;  Surgeon: Robert Bellow, MD;  Location: [place];  Service: Endoscopy;  Laterality: N/A;  DM   DILATION AND CURETTAGE OF UTERUS     HALLUX VALGUS CORRECTION Left     Prior to Admission medications   Medication Sig Start Date End Date Taking? Authorizing Provider  albuterol (VENTOLIN HFA) 108 (90 Base) MCG/ACT inhaler 2 puffs q.i.d. p.r.n. short of breath, wheezing, or cough 01/02/19  Yes [provider]  Alpha-D-Galactosidase (BEANO PO) Take by mouth as needed.   Yes [provider]  aspirin EC 81 MG tablet Take 81 mg by mouth daily.   Yes [provider]  Azelastine-Fluticasone 137-50 MCG/ACT SUSP Place 1 spray into the nose 2 (two) times daily. 02/03/15  Yes Lada, Satira Anis, MD  Cinnamon Bark POWD 1 Dose by Does not apply route daily.   Yes [provider]  clotrimazole-betamethasone (LOTRISONE) cream Apply topically. 09/08/14  Yes [provider]  fluticasone (FLONASE) 50 MCG/ACT nasal spray Place 2 sprays into both nostrils daily.   Yes [provider]   GLUCOSAMINE CHONDROITIN COMPLX PO Take 1 Dose by mouth daily.   Yes [provider]  hydroxychloroquine (PLAQUENIL) 200 MG tablet Take 200 mg by mouth 2 (two) times daily.   Yes [provider]  ketoconazole (NIZORAL) 2 % cream Apply 1 application topically daily.   Yes [provider]  metoprolol succinate (TOPROL-XL) 50 MG 24 hr tablet TAKE 1 TABLET MOUTH ONCE DAILY WITH A MEAL 04/20/16  Yes Lada, Satira Anis, MD  montelukast (SINGULAIR) 10 MG tablet Take 1 tablet (10 mg total) by mouth at bedtime. Patient taking differently: Take 10 mg by mouth daily. 09/29/15  Yes Lada, Satira Anis, MD  Multiple Vitamins-Minerals (ZINC PO) Take by mouth daily.   Yes [provider]  pilocarpine (SALAGEN) 5 MG tablet Take 5 mg by mouth 3 (three) times daily.   Yes [provider]  Probiotic Product (DIGESTIVE ADVANTAGE PO) Take by mouth at bedtime.   Yes [provider]  simvastatin (ZOCOR) 10 MG tablet TAKE ONE TABLET BY MOUTH EVERY NIGHT AT BEDTIME 05/22/16  Yes Lada, Satira Anis, MD  telmisartan (MICARDIS) 80 MG tablet Take 80 mg by mouth daily.   Yes [provider]  TURMERIC PO Take by mouth daily.   Yes [provider]  vitamin B-12 (CYANOCOBALAMIN) 1000 MCG tablet Take 1,000 mcg by mouth daily.   Yes [provider]  traMADol (ULTRAM) 50 MG  tablet Take by mouth every 6 (six) hours as needed. Patient not taking: Reported on 07/11/2022    [provider]    Allergies as of 07/05/2022 - Review Complete 05/31/2021  Allergen Reaction Noted   Amlodipine besylate Swelling 01/27/2015    Family History  Problem Relation Age of Onset   Hypertension Mother    Heart disease Mother 82       Bypass surgery   Cancer Father        lung   Heart disease Father 19       bypass surgery   Breast cancer Cousin     Social History   Socioeconomic History   Marital status: Widowed    Spouse name: Not on file   Number of  children: Not on file   Years of education: Not on file   Highest education level: Not on file  Occupational History   Not on file  Tobacco Use   Smoking status: Former    Packs/day: 0.50    Years: 10.00    Total pack years: 5.00    Types: Cigarettes    Quit date: 12/26/1966    Years since quitting: 55.5   Smokeless tobacco: Never  Vaping Use   Vaping Use: Never used  Substance and Sexual Activity   Alcohol use: No   Drug use: No   Sexual activity: Not on file  Other Topics Concern   Not on file  Social History Narrative   Not on file   Social Determinants of Health   Financial Resource Strain: Not on file  Food Insecurity: Not on file  Transportation Needs: Not on file  Physical Activity: Not on file  Stress: Not on file  Social Connections: Not on file  Intimate Partner Violence: Not on file    Review of Systems: See HPI, otherwise negative ROS  Physical Exam: BP (!) 156/70   Pulse 60   Temp (!) 97.2 F (36.2 C) (Temporal)   Resp 16   Ht '5\' 6"'$  (1.676 m)   Wt 99.8 kg   SpO2 100%   BMI 35.51 kg/m  General:   Alert, cooperative in NAD Head:  Normocephalic and atraumatic. Respiratory:  Normal work of breathing. Cardiovascular:  RRR  Impression/Plan: Carrie Wong is here for cataract surgery.  Risks, benefits, limitations, and alternatives regarding cataract surgery have been reviewed with the patient.  Questions have been answered.  All parties agreeable.   Benay Pillow, MD  07/16/2022, 9:25 AM

## 2022-07-16 NOTE — Anesthesia Postprocedure Evaluation (Signed)
Anesthesia Post Note  Patient: Carrie Wong  Procedure(s) Performed: CATARACT EXTRACTION PHACO AND INTRAOCULAR LENS PLACEMENT (IOC) LEFT DIABETIC (Left: Eye)  Patient location during evaluation: PACU Anesthesia Type: MAC Level of consciousness: awake and alert Pain management: pain level controlled Vital Signs Assessment: post-procedure vital signs reviewed and stable Respiratory status: spontaneous breathing, nonlabored ventilation, respiratory function stable and patient connected to nasal cannula oxygen Cardiovascular status: stable and blood pressure returned to baseline Postop Assessment: no apparent nausea or vomiting Anesthetic complications: no   There were no known notable events for this encounter.   Last Vitals:  Vitals:   07/16/22 0846  BP: (!) 156/70  Pulse: 60  Resp: 16  Temp: (!) 36.2 C  SpO2: 100%    Last Pain:  Vitals:   07/16/22 0846  TempSrc: Temporal  PainSc: 2                  Ilene Qua

## 2022-07-16 NOTE — Anesthesia Preprocedure Evaluation (Signed)
Anesthesia Evaluation  Patient identified by MRN, date of birth, ID band Patient awake    Reviewed: Allergy & Precautions, NPO status , Patient's Chart, lab work & pertinent test results  History of Anesthesia Complications Negative for: history of anesthetic complications  Airway Mallampati: III  TM Distance: >3 FB Neck ROM: full    Dental  (+) Chipped   Pulmonary asthma , sleep apnea , former smoker   Pulmonary exam normal        Cardiovascular hypertension, Pt. on home beta blockers and Pt. on medications negative cardio ROS Normal cardiovascular exam     Neuro/Psych  negative psych ROS   GI/Hepatic Neg liver ROS,GERD  ,,  Endo/Other  negative endocrine ROSdiabetes    Renal/GU      Musculoskeletal  (+) Arthritis ,  Fibromyalgia -  Abdominal   Peds  Hematology negative hematology ROS (+)   Anesthesia Other Findings Past Medical History: No date: Allergy No date: Arthritis of knee, left No date: Asthma No date: Diabetes mellitus without complication (HCC) No date: Fibromyalgia No date: Fibromyalgia No date: GERD (gastroesophageal reflux disease) No date: Hip arthritis No date: Hyperlipidemia No date: Hypertension No date: Obesity No date: Plantar fasciitis No date: Sleep apnea No date: Tinea 10/22/2015: Type 2 diabetes mellitus ([place]) No date: Wears hearing aid in both ears  Past Surgical History: 01/26/2012: ABDOMINAL HYSTERECTOMY     Comment:  Total vaginal for precancerous cells (ovaries remain) No date: BILATERAL CARPAL TUNNEL RELEASE; Bilateral 03/29/2021: COLONOSCOPY WITH PROPOFOL; N/A     Comment:  Procedure: COLONOSCOPY WITH PROPOFOL;  Surgeon: Robert Bellow, MD;  Location: [facility];  Service:               Endoscopy;  Laterality: N/A;  DM No date: DILATION AND CURETTAGE OF UTERUS No date: [place]; Left  BMI    Body Mass Index: 35.51 kg/m       Reproductive/Obstetrics negative OB ROS                             Anesthesia Physical Anesthesia Plan  ASA: 3  Anesthesia Plan: MAC   Post-op Pain Management: Minimal or no pain anticipated   Induction:   PONV Risk Score and Plan:   Airway Management Planned: Natural Airway and Nasal Cannula  Additional Equipment:   Intra-op Plan:   Post-operative Plan:   Informed Consent: I have reviewed the patients History and Physical, chart, labs and discussed the procedure including the risks, benefits and alternatives for the proposed anesthesia with the patient or authorized representative who has indicated his/her understanding and acceptance.     Dental Advisory Given  Plan Discussed with: Anesthesiologist, CRNA and Surgeon  Anesthesia Plan Comments: (Patient consented for risks of anesthesia including but not limited to:  - adverse reactions to medications - risk of airway placement if required - damage to eyes, teeth, lips or other oral mucosa - nerve damage due to positioning  - sore throat or hoarseness - Damage to heart, brain, nerves, lungs, other parts of body or loss of life  Patient voiced understanding.)       Anesthesia Quick Evaluation

## 2022-07-17 ENCOUNTER — Encounter: Payer: Self-pay | Admitting: Ophthalmology

## 2022-07-24 ENCOUNTER — Encounter: Payer: Self-pay | Admitting: Ophthalmology

## 2022-07-25 DIAGNOSIS — F4381 Prolonged grief disorder: Secondary | ICD-10-CM | POA: Diagnosis not present

## 2022-07-26 ENCOUNTER — Encounter: Payer: Self-pay | Admitting: Ophthalmology

## 2022-07-26 DIAGNOSIS — H2511 Age-related nuclear cataract, right eye: Secondary | ICD-10-CM | POA: Diagnosis not present

## 2022-07-27 NOTE — Discharge Instructions (Signed)

## 2022-07-30 ENCOUNTER — Encounter
Admission: RE | Disposition: A | Discharge status: Home / Self Care | Payer: Self-pay | Source: Home / Self Care | Attending: Ophthalmology

## 2022-07-30 ENCOUNTER — Other Ambulatory Visit: Payer: Self-pay

## 2022-07-30 ENCOUNTER — Ambulatory Visit: Payer: PPO | Admitting: General Practice

## 2022-07-30 ENCOUNTER — Ambulatory Visit
Admission: RE | Admit: 2022-07-30 | Discharge: 2022-07-30 | Disposition: A | Discharge status: Home / Self Care | Payer: PPO | Attending: Ophthalmology | Admitting: Ophthalmology

## 2022-07-30 ENCOUNTER — Encounter: Payer: Self-pay | Admitting: Ophthalmology

## 2022-07-30 DIAGNOSIS — M797 Fibromyalgia: Secondary | ICD-10-CM | POA: Insufficient documentation

## 2022-07-30 DIAGNOSIS — G4730 Sleep apnea, unspecified: Secondary | ICD-10-CM | POA: Insufficient documentation

## 2022-07-30 DIAGNOSIS — H2511 Age-related nuclear cataract, right eye: Secondary | ICD-10-CM | POA: Insufficient documentation

## 2022-07-30 DIAGNOSIS — E785 Hyperlipidemia, unspecified: Secondary | ICD-10-CM | POA: Diagnosis not present

## 2022-07-30 DIAGNOSIS — E1136 Type 2 diabetes mellitus with diabetic cataract: Secondary | ICD-10-CM | POA: Insufficient documentation

## 2022-07-30 DIAGNOSIS — I10 Essential (primary) hypertension: Secondary | ICD-10-CM | POA: Insufficient documentation

## 2022-07-30 DIAGNOSIS — J45909 Unspecified asthma, uncomplicated: Secondary | ICD-10-CM | POA: Insufficient documentation

## 2022-07-30 DIAGNOSIS — Z87891 Personal history of nicotine dependence: Secondary | ICD-10-CM | POA: Diagnosis not present

## 2022-07-30 DIAGNOSIS — K219 Gastro-esophageal reflux disease without esophagitis: Secondary | ICD-10-CM | POA: Insufficient documentation

## 2022-07-30 HISTORY — PX: CATARACT EXTRACTION W/PHACO: SHX586

## 2022-07-30 SURGERY — PHACOEMULSIFICATION, CATARACT, WITH IOL INSERTION
Anesthesia: Monitor Anesthesia Care | Site: Eye | Laterality: Right

## 2022-07-30 MED ORDER — LIDOCAINE HCL (PF) 2 % IJ SOLN
INTRAOCULAR | Status: DC | PRN
  Administered 2022-07-30: 1 mL via INTRAOCULAR

## 2022-07-30 MED ORDER — MOXIFLOXACIN HCL 0.5 % OP SOLN
OPHTHALMIC | Status: DC | PRN
  Administered 2022-07-30: .2 mL via OPHTHALMIC

## 2022-07-30 MED ORDER — SIGHTPATH DOSE#1 BSS IO SOLN
INTRAOCULAR | Status: DC | PRN
  Administered 2022-07-30: 74 mL via OPHTHALMIC

## 2022-07-30 MED ORDER — ARMC OPHTHALMIC DILATING DROPS
1.0000 | OPHTHALMIC | Status: DC | PRN
  Administered 2022-07-30 (×3): 1 via OPHTHALMIC

## 2022-07-30 MED ORDER — SIGHTPATH DOSE#1 NA HYALUR & NA CHOND-NA HYALUR IO KIT
PACK | INTRAOCULAR | Status: DC | PRN
  Administered 2022-07-30: 1 via OPHTHALMIC

## 2022-07-30 MED ORDER — LACTATED RINGERS IV SOLN: INTRAVENOUS | Status: DC

## 2022-07-30 MED ORDER — MIDAZOLAM HCL 2 MG/2ML IJ SOLN
INTRAMUSCULAR | Status: DC | PRN
  Administered 2022-07-30: 1 mg via INTRAVENOUS

## 2022-07-30 MED ORDER — TETRACAINE HCL 0.5 % OP SOLN
1.0000 [drp] | OPHTHALMIC | Status: DC | PRN
  Administered 2022-07-30 (×3): 1 [drp] via OPHTHALMIC

## 2022-07-30 MED ORDER — SIGHTPATH DOSE#1 BSS IO SOLN
INTRAOCULAR | Status: DC | PRN
  Administered 2022-07-30: 15 mL

## 2022-07-30 MED ORDER — FENTANYL CITRATE (PF) 100 MCG/2ML IJ SOLN
INTRAMUSCULAR | Status: DC | PRN
  Administered 2022-07-30: 50 ug via INTRAVENOUS

## 2022-07-30 SURGICAL SUPPLY — 13 items
CATARACT SUITE SIGHTPATH (MISCELLANEOUS) ×1 IMPLANT
DISSECTOR HYDRO NUCLEUS 50X22 (MISCELLANEOUS) ×1 IMPLANT
FEE CATARACT SUITE SIGHTPATH (MISCELLANEOUS) ×1 IMPLANT
GLOVE SURG GAMMEX PI TX LF 7.5 (GLOVE) ×1 IMPLANT
GLOVE SURG SYN 8.5  E (GLOVE) ×1
GLOVE SURG SYN 8.5 E (GLOVE) ×1 IMPLANT
GLOVE SURG SYN 8.5 PF PI (GLOVE) ×1 IMPLANT
LENS IOL TECNIS EYHANCE 20.5 (Intraocular Lens) IMPLANT
NDL FILTER BLUNT 18X1 1/2 (NEEDLE) ×1 IMPLANT
NEEDLE FILTER BLUNT 18X1 1/2 (NEEDLE) ×1 IMPLANT
SYR 3ML LL SCALE MARK (SYRINGE) ×1 IMPLANT
SYR 5ML LL (SYRINGE) ×1 IMPLANT
WATER STERILE IRR 250ML POUR (IV SOLUTION) ×1 IMPLANT

## 2022-07-30 NOTE — Op Note (Signed)
OPERATIVE NOTE  MONSERATH NEFF [id number] 07/30/2022   PREOPERATIVE DIAGNOSIS:  Nuclear sclerotic cataract right eye.  H25.11   POSTOPERATIVE DIAGNOSIS:    Nuclear sclerotic cataract right eye.     PROCEDURE:  Phacoemusification with posterior chamber intraocular lens placement of the right eye   LENS:   Implant Name Type Inv. Item Serial No. Manufacturer Lot No. LRB No. Used Action  LENS IOL TECNIS EYHANCE 20.5 - [id number] Intraocular Lens LENS IOL TECNIS EYHANCE 20.5 [id number] [employer]  Right 1 Implanted       Procedure(s): CATARACT EXTRACTION PHACO AND INTRAOCULAR LENS PLACEMENT (IOC) RIGHT DIABETIC  3.28  00:25.3 (Right)  DIB00 +20.5   ULTRASOUND TIME: 0 minutes 25 seconds.  CDE 3.28   SURGEON:  Benay Pillow, MD, MPH  ANESTHESIOLOGIST: Anesthesiologist: Martha Clan, MD CRNA: Moises Blood, CRNA   ANESTHESIA:  Topical with tetracaine drops augmented with 1% preservative-free intracameral lidocaine.  ESTIMATED BLOOD LOSS: less than 1 mL.   COMPLICATIONS:  None.   DESCRIPTION OF PROCEDURE:  The patient was identified in the holding room and transported to the operating room and placed in the supine position under the operating microscope.  The right eye was identified as the operative eye and it was prepped and draped in the usual sterile ophthalmic fashion.   A 1.0 millimeter clear-corneal paracentesis was made at the 10:30 position. 0.5 ml of preservative-free 1% lidocaine with epinephrine was injected into the anterior chamber.  The anterior chamber was filled with viscoelastic.  A 2.4 millimeter keratome was used to make a near-clear corneal incision at the 8:00 position.  A curvilinear capsulorrhexis was made with a cystotome and capsulorrhexis forceps.  Balanced salt solution was used to hydrodissect and hydrodelineate the nucleus.   Phacoemulsification was then used in stop and chop fashion to remove the lens nucleus and epinucleus.  The remaining cortex was then  removed using the irrigation and aspiration handpiece. Viscoelastic was then placed into the capsular bag to distend it for lens placement.  A lens was then injected into the capsular bag.  The remaining viscoelastic was aspirated.   Wounds were hydrated with balanced salt solution.  The anterior chamber was inflated to a physiologic pressure with balanced salt solution.   Intracameral vigamox 0.1 mL undiluted was injected into the eye and a drop placed onto the ocular surface.  No wound leaks were noted.  The patient was taken to the recovery room in stable condition without complications of anesthesia or surgery  Benay Pillow 07/30/2022, 9:55 AM

## 2022-07-30 NOTE — Anesthesia Preprocedure Evaluation (Signed)
Anesthesia Evaluation  Patient identified by MRN, date of birth, ID band Patient awake    Reviewed: Allergy & Precautions, NPO status , Patient's Chart, lab work & pertinent test results  History of Anesthesia Complications Negative for: history of anesthetic complications  Airway Mallampati: III  TM Distance: >3 FB Neck ROM: full    Dental  (+) Chipped   Pulmonary asthma , sleep apnea , former smoker   Pulmonary exam normal        Cardiovascular hypertension, Pt. on home beta blockers and Pt. on medications negative cardio ROS Normal cardiovascular exam     Neuro/Psych  negative psych ROS   GI/Hepatic Neg liver ROS,GERD  ,,  Endo/Other  negative endocrine ROSdiabetes    Renal/GU      Musculoskeletal  (+) Arthritis ,  Fibromyalgia -  Abdominal   Peds  Hematology negative hematology ROS (+)   Anesthesia Other Findings Past Medical History: No date: Allergy No date: Arthritis of knee, left No date: Asthma No date: Diabetes mellitus without complication (HCC) No date: Fibromyalgia No date: Fibromyalgia No date: GERD (gastroesophageal reflux disease) No date: Hip arthritis No date: Hyperlipidemia No date: Hypertension No date: Obesity No date: Plantar fasciitis No date: Sleep apnea No date: Tinea 10/22/2015: Type 2 diabetes mellitus ([place]) No date: Wears hearing aid in both ears  Past Surgical History: 01/26/2012: ABDOMINAL HYSTERECTOMY     Comment:  Total vaginal for precancerous cells (ovaries remain) No date: BILATERAL CARPAL TUNNEL RELEASE; Bilateral 03/29/2021: COLONOSCOPY WITH PROPOFOL; N/A     Comment:  Procedure: COLONOSCOPY WITH PROPOFOL;  Surgeon: Robert Bellow, MD;  Location: [facility];  Service:               Endoscopy;  Laterality: N/A;  DM No date: DILATION AND CURETTAGE OF UTERUS No date: [place]; Left  BMI    Body Mass Index: 35.51 kg/m       Reproductive/Obstetrics negative OB ROS                              Anesthesia Physical Anesthesia Plan  ASA: 3  Anesthesia Plan: MAC   Post-op Pain Management: Minimal or no pain anticipated   Induction:   PONV Risk Score and Plan:   Airway Management Planned: Natural Airway and Nasal Cannula  Additional Equipment:   Intra-op Plan:   Post-operative Plan:   Informed Consent: I have reviewed the patients History and Physical, chart, labs and discussed the procedure including the risks, benefits and alternatives for the proposed anesthesia with the patient or authorized representative who has indicated his/her understanding and acceptance.     Dental Advisory Given  Plan Discussed with: Anesthesiologist, CRNA and Surgeon  Anesthesia Plan Comments: (Patient consented for risks of anesthesia including but not limited to:  - adverse reactions to medications - risk of airway placement if required - damage to eyes, teeth, lips or other oral mucosa - nerve damage due to positioning  - sore throat or hoarseness - Damage to heart, brain, nerves, lungs, other parts of body or loss of life  Patient voiced understanding.)        Anesthesia Quick Evaluation

## 2022-07-30 NOTE — Anesthesia Postprocedure Evaluation (Signed)
Anesthesia Post Note  Patient: OVIE EASTEP  Procedure(s) Performed: CATARACT EXTRACTION PHACO AND INTRAOCULAR LENS PLACEMENT (IOC) RIGHT DIABETIC  3.28  00:25.3 (Right: Eye)  Patient location during evaluation: PACU Anesthesia Type: MAC Level of consciousness: awake and alert Pain management: pain level controlled Vital Signs Assessment: post-procedure vital signs reviewed and stable Respiratory status: spontaneous breathing, nonlabored ventilation, respiratory function stable and patient connected to nasal cannula oxygen Cardiovascular status: stable and blood pressure returned to baseline Postop Assessment: no apparent nausea or vomiting Anesthetic complications: no   No notable events documented.   Last Vitals:  Vitals:   07/30/22 0956 07/30/22 1000  BP: 138/73 (!) 141/61  Pulse: (!) 57 (!) 55  Resp: 10 15  Temp: 36.4 C 36.4 C  SpO2: 98% 97%    Last Pain:  Vitals:   07/30/22 1000  PainSc: 0-No pain                 Martha Clan

## 2022-07-30 NOTE — H&P (Signed)
[facility]   Primary Care Physician:  Mary[place] Pink, MD Ophthalmologist: Dr. Benay Pillow  Pre-Procedure History & Physical: HPI:  Carrie Wong is a 81 y.o. female here for cataract surgery.   Past Medical History:  Diagnosis Date   Allergy    Arthritis of knee, left    Asthma    Diabetes mellitus without complication (HCC)    Type 2   Fibromyalgia    GERD (gastroesophageal reflux disease)    Hip arthritis    Hyperlipidemia    Hypertension    Obesity    Plantar fasciitis    Sleep apnea    Tinea    Type 2 diabetes mellitus ([place]) 10/22/2015   Wears hearing aid in both ears     Past Surgical History:  Procedure Laterality Date   ABDOMINAL HYSTERECTOMY  01/26/2012   Total vaginal for precancerous cells (ovaries remain)   BILATERAL CARPAL TUNNEL RELEASE Bilateral    CATARACT EXTRACTION W/PHACO Left 07/16/2022   Procedure: CATARACT EXTRACTION PHACO AND INTRAOCULAR LENS PLACEMENT ([place]) LEFT DIABETIC;  Surgeon: Eulogio Bear, MD;  Location: [place];  Service: Ophthalmology;  Laterality: Left;  3.55 00:30.3   COLONOSCOPY WITH PROPOFOL N/A 03/29/2021   Procedure: COLONOSCOPY WITH PROPOFOL;  Surgeon: Robert Bellow, MD;  Location: [facility];  Service: Endoscopy;  Laterality: N/A;  DM   DILATION AND CURETTAGE OF UTERUS     HALLUX VALGUS CORRECTION Left     Prior to Admission medications   Medication Sig Start Date End Date Taking? Authorizing Provider  albuterol (VENTOLIN HFA) 108 (90 Base) MCG/ACT inhaler 2 puffs q.i.d. p.r.n. short of breath, wheezing, or cough 01/02/19  Yes [provider]  Alpha-D-Galactosidase (BEANO PO) Take by mouth as needed.   Yes [provider]  aspirin EC 81 MG tablet Take 81 mg by mouth daily.   Yes [provider]  Azelastine-Fluticasone 137-50 MCG/ACT SUSP Place 1 spray into the nose 2 (two) times daily. 02/03/15  Yes Lada, Satira Anis, MD  Cinnamon Bark POWD 1 Dose by Does not apply route  daily.   Yes [provider]  fluticasone (FLONASE) 50 MCG/ACT nasal spray Place 2 sprays into both nostrils daily.   Yes [provider]  GLUCOSAMINE CHONDROITIN COMPLX PO Take 1 Dose by mouth daily.   Yes [provider]  hydroxychloroquine (PLAQUENIL) 200 MG tablet Take 200 mg by mouth 2 (two) times daily.   Yes [provider]  Multiple Vitamins-Minerals (ZINC PO) Take by mouth daily.   Yes [provider]  pilocarpine (SALAGEN) 5 MG tablet Take 5 mg by mouth 3 (three) times daily.   Yes [provider]  Probiotic Product (DIGESTIVE ADVANTAGE PO) Take by mouth at bedtime.   Yes [provider]  simvastatin (ZOCOR) 10 MG tablet TAKE ONE TABLET BY MOUTH EVERY NIGHT AT BEDTIME 05/22/16  Yes Lada, Satira Anis, MD  telmisartan (MICARDIS) 80 MG tablet Take 80 mg by mouth daily.   Yes [provider]  TURMERIC PO Take by mouth daily.   Yes [provider]  vitamin B-12 (CYANOCOBALAMIN) 1000 MCG tablet Take 1,000 mcg by mouth daily.   Yes [provider]  clotrimazole-betamethasone (LOTRISONE) cream Apply topically. 09/08/14   [provider]  ketoconazole (NIZORAL) 2 % cream Apply 1 application topically daily.    [provider]  metoprolol succinate (TOPROL-XL) 50 MG 24 hr tablet TAKE 1 TABLET MOUTH ONCE DAILY WITH A MEAL 04/20/16   Lada,  Satira Anis, MD  montelukast (SINGULAIR) 10 MG tablet Take 1 tablet (10 mg total) by mouth at bedtime. Patient taking differently: Take 10 mg by mouth daily. 09/29/15   Arnetha Courser, MD  traMADol (ULTRAM) 50 MG tablet Take by mouth every 6 (six) hours as needed. Patient not taking: Reported on 07/11/2022    [provider]    Allergies as of 07/05/2022 - Review Complete 05/31/2021  Allergen Reaction Noted   Amlodipine besylate Swelling 01/27/2015    Family History  Problem Relation Age of Onset   Hypertension Mother    Heart disease Mother 33        Bypass surgery   Cancer Father        lung   Heart disease Father 26       bypass surgery   Breast cancer Cousin     Social History   Socioeconomic History   Marital status: Widowed    Spouse name: Not on file   Number of children: Not on file   Years of education: Not on file   Highest education level: Not on file  Occupational History   Not on file  Tobacco Use   Smoking status: Former    Packs/day: 0.50    Years: 10.00    Total pack years: 5.00    Types: Cigarettes    Quit date: 12/26/1966    Years since quitting: 55.6   Smokeless tobacco: Never  Vaping Use   Vaping Use: Never used  Substance and Sexual Activity   Alcohol use: No   Drug use: No   Sexual activity: Not on file  Other Topics Concern   Not on file  Social History Narrative   Not on file   Social Determinants of Health   Financial Resource Strain: Not on file  Food Insecurity: Not on file  Transportation Needs: Not on file  Physical Activity: Not on file  Stress: Not on file  Social Connections: Not on file  Intimate Partner Violence: Not on file    Review of Systems: See HPI, otherwise negative ROS  Physical Exam: BP 139/65   Pulse 62   Temp (!) 97 F (36.1 C)   Ht '5\' 6"'$  (1.676 m)   Wt 101.2 kg   SpO2 98%   BMI 35.99 kg/m  General:   Alert, cooperative in NAD Head:  Normocephalic and atraumatic. Respiratory:  Normal work of breathing. Cardiovascular:  RRR  Impression/Plan: Carrie Wong is here for cataract surgery.  Risks, benefits, limitations, and alternatives regarding cataract surgery have been reviewed with the patient.  Questions have been answered.  All parties agreeable.   Benay Pillow, MD  07/30/2022, 9:29 AM

## 2022-07-30 NOTE — Transfer of Care (Signed)
Immediate Anesthesia Transfer of Care Note  Patient: Carrie Wong  Procedure(s) Performed: CATARACT EXTRACTION PHACO AND INTRAOCULAR LENS PLACEMENT (IOC) RIGHT DIABETIC  3.28  00:25.3 (Right: Eye)  Patient Location: PACU  Anesthesia Type: MAC  Level of Consciousness: awake, alert  and patient cooperative  Airway and Oxygen Therapy: Patient Spontanous Breathing and Patient connected to supplemental oxygen  Post-op Assessment: Post-op Vital signs reviewed, Patient's Cardiovascular Status Stable, Respiratory Function Stable, Patent Airway and No signs of Nausea or vomiting  Post-op Vital Signs: Reviewed and stable  Complications: No notable events documented.

## 2022-07-31 ENCOUNTER — Encounter: Payer: Self-pay | Admitting: Ophthalmology

## 2022-10-02 DIAGNOSIS — J029 Acute pharyngitis, unspecified: Secondary | ICD-10-CM | POA: Diagnosis not present

## 2022-10-02 DIAGNOSIS — R059 Cough, unspecified: Secondary | ICD-10-CM | POA: Diagnosis not present

## 2022-10-16 DIAGNOSIS — J0100 Acute maxillary sinusitis, unspecified: Secondary | ICD-10-CM | POA: Diagnosis not present

## 2022-10-25 DIAGNOSIS — Z79899 Other long term (current) drug therapy: Secondary | ICD-10-CM | POA: Diagnosis not present

## 2022-10-25 DIAGNOSIS — M3505 Sjogren syndrome with inflammatory arthritis: Secondary | ICD-10-CM | POA: Diagnosis not present

## 2022-10-26 IMAGING — US US EXTREM LOW VENOUS
1 series · 13 of 24 positions shown · non-contrast
Comparison: None.

CLINICAL DATA: Bilateral lower extremity pain and edema, left
greater than right. Evaluate for DVT.



[Series 1: us venous img lower bilat (dvt) · portal-venous · 13 of 60 slices shown]
[im 1/60]
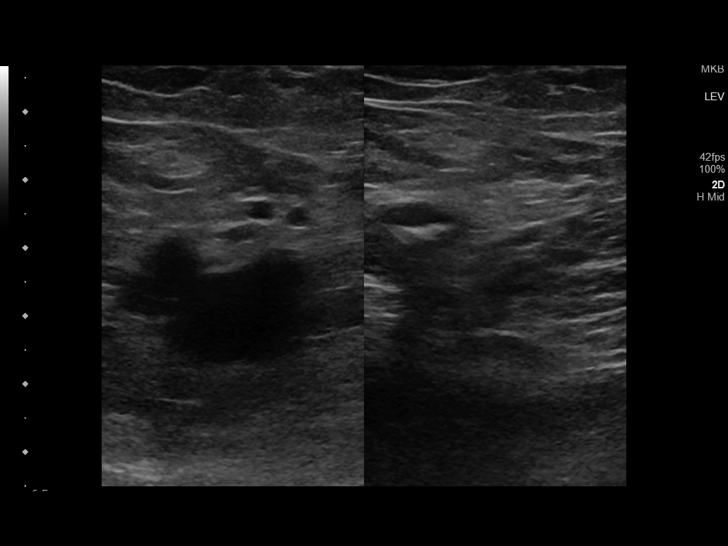
[im 6/60]
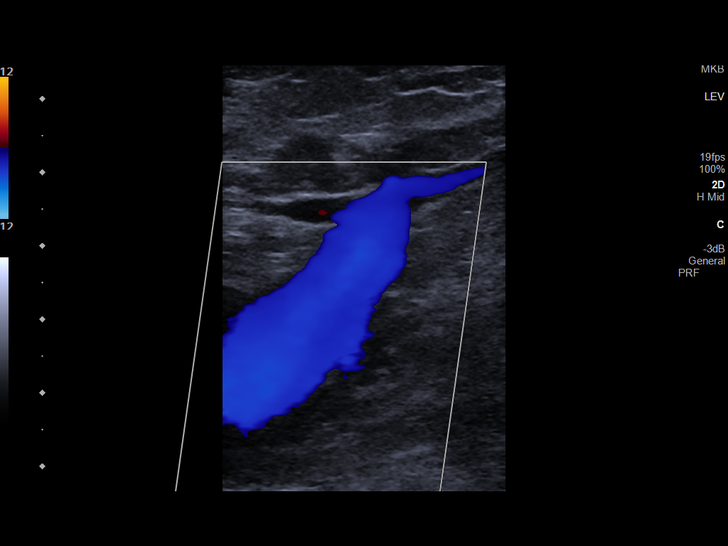
[im 11/60]
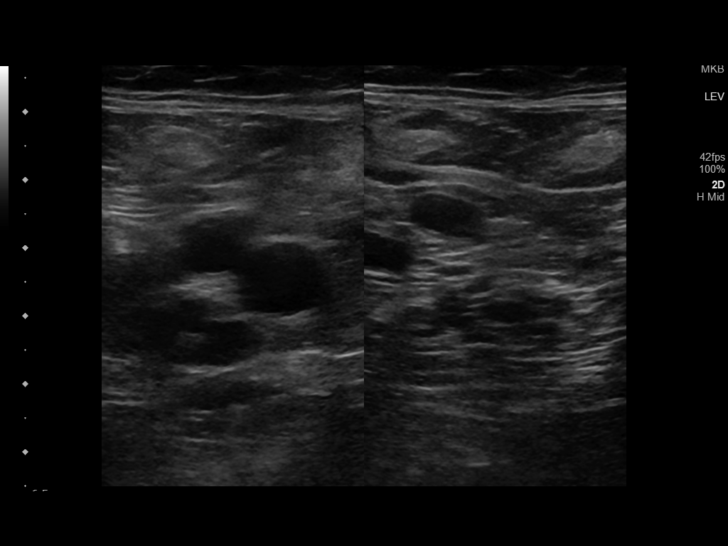
[im 16/60]
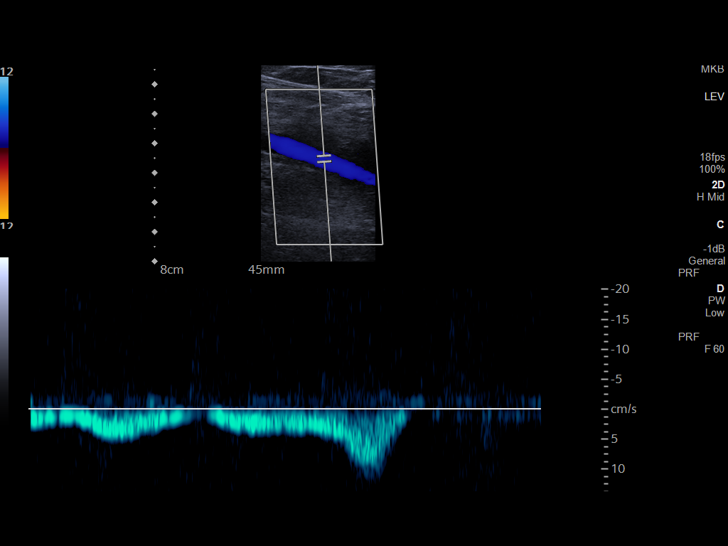
[im 21/60]
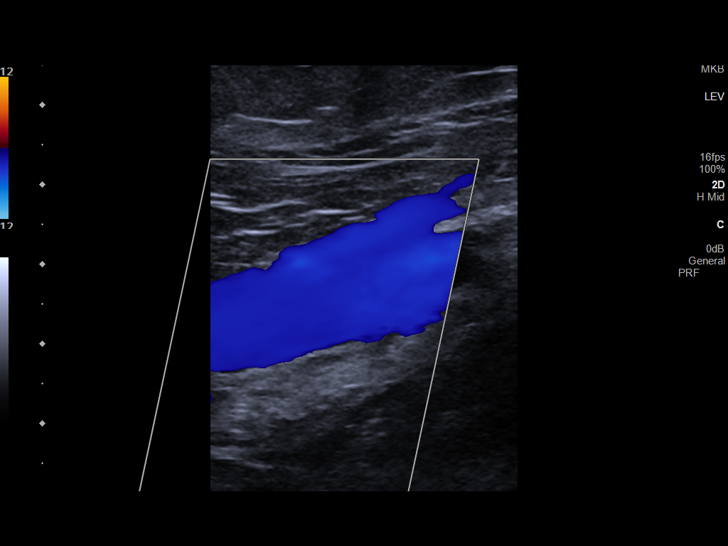
[im 26/60]
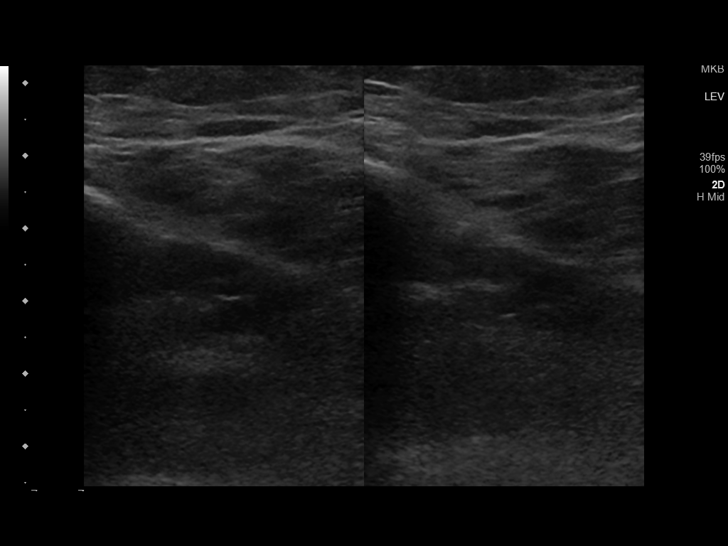
[im 31/60]
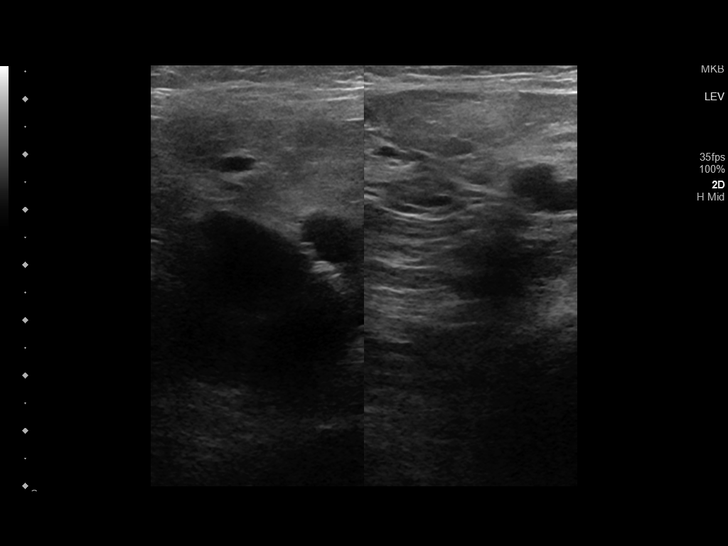
[im 34/60]
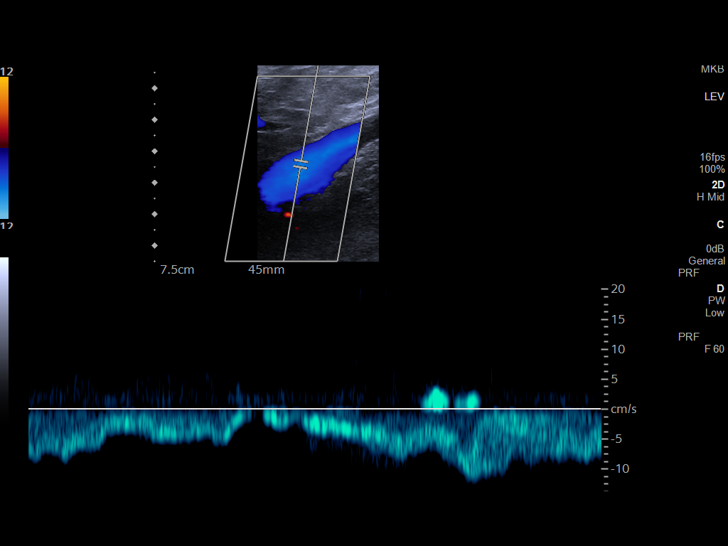
[im 39/60]
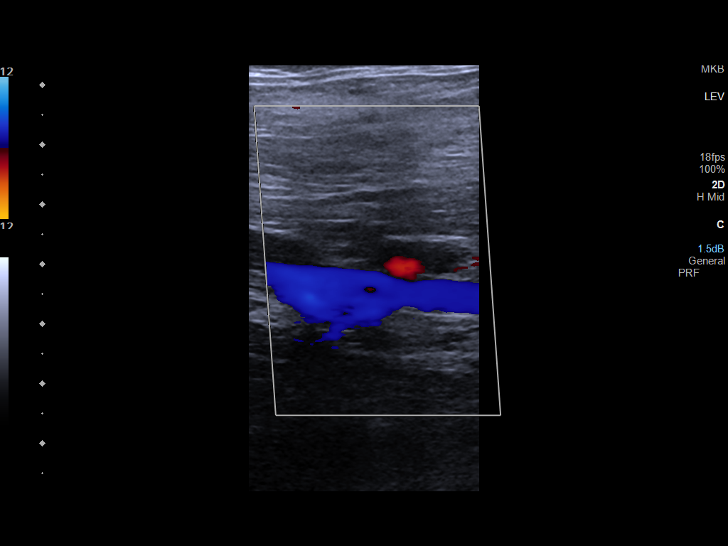
[im 44/60]
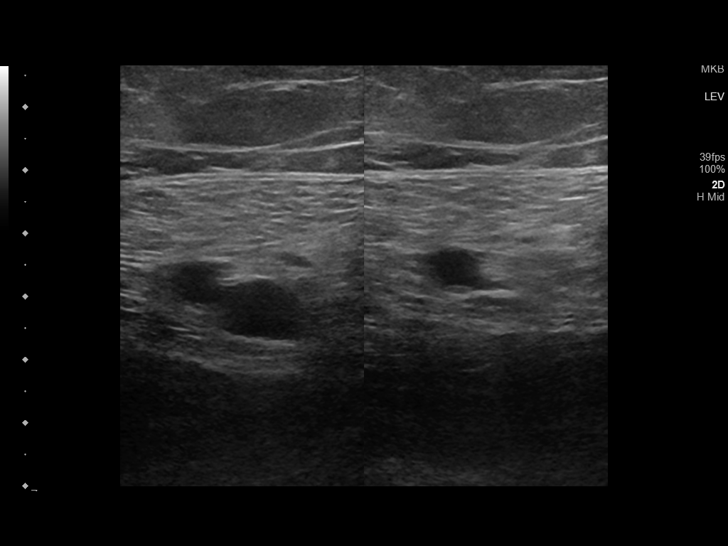
[im 49/60]
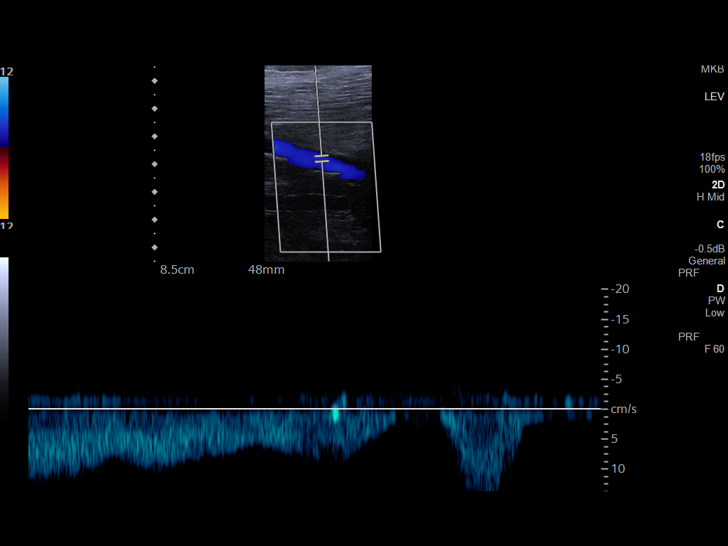
[im 54/60]
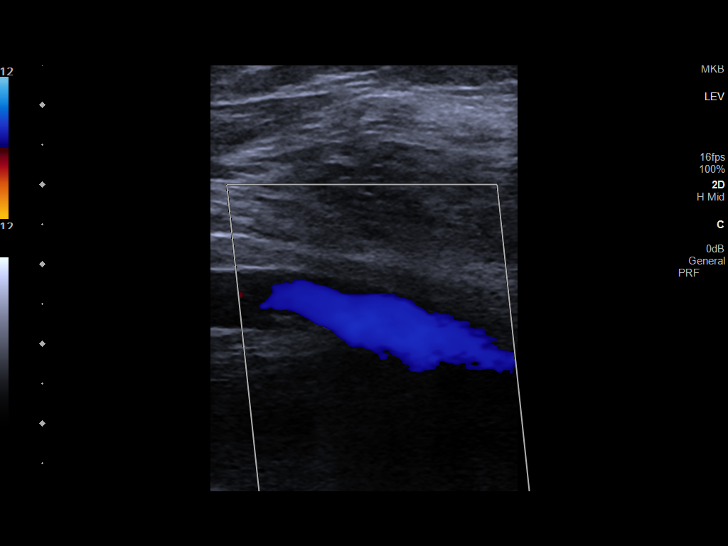
[im 60/60]
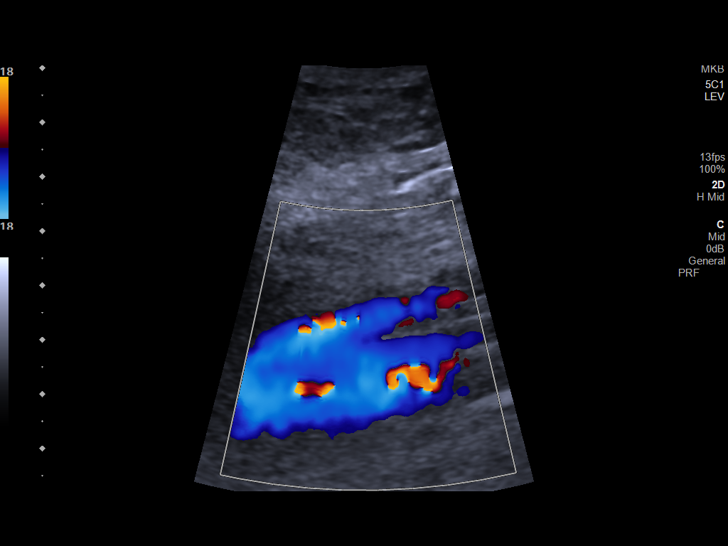

[13 of 24 positions shown; findings below may reference images not displayed]

FINDINGS: RIGHT LOWER EXTREMITY

Common Femoral Vein: No evidence of thrombus. Normal
compressibility, respiratory phasicity and response to augmentation.

Saphenofemoral Junction: No evidence of thrombus. Normal
compressibility and flow on color Doppler imaging.

Profunda Femoral Vein: No evidence of thrombus. Normal
compressibility and flow on color Doppler imaging.

Femoral Vein: No evidence of thrombus. Normal compressibility,
respiratory phasicity and response to augmentation.

Popliteal Vein: No evidence of thrombus. Normal compressibility,
respiratory phasicity and response to augmentation.

Calf Veins: No evidence of thrombus. Normal compressibility and flow
on color Doppler imaging.

Superficial Great Saphenous Vein: No evidence of thrombus. Normal
compressibility.

Venous Reflux:  None.

Other Findings:  None.

LEFT LOWER EXTREMITY

Common Femoral Vein: No evidence of thrombus. Normal
compressibility, respiratory phasicity and response to augmentation.

Saphenofemoral Junction: No evidence of thrombus. Normal
compressibility and flow on color Doppler imaging.

Profunda Femoral Vein: No evidence of thrombus. Normal
compressibility and flow on color Doppler imaging.

Femoral Vein: No evidence of thrombus. Normal compressibility,
respiratory phasicity and response to augmentation.

Popliteal Vein: No evidence of thrombus. Normal compressibility,
respiratory phasicity and response to augmentation.

Calf Veins: No evidence of thrombus. Normal compressibility and flow
on color Doppler imaging.

Superficial Great Saphenous Vein: No evidence of thrombus. Normal
compressibility.

Venous Reflux:  None.

Other Findings:  None.
IMPRESSION: No evidence of DVT within either lower extremity.

## 2022-10-26 IMAGING — CR DG CHEST 2V
2 series · 2 of 2 positions shown · non-contrast
Comparison: None.

CLINICAL DATA: leg swelling, L>R

EXAM:
CHEST - 2 VIEW

[chest pa]
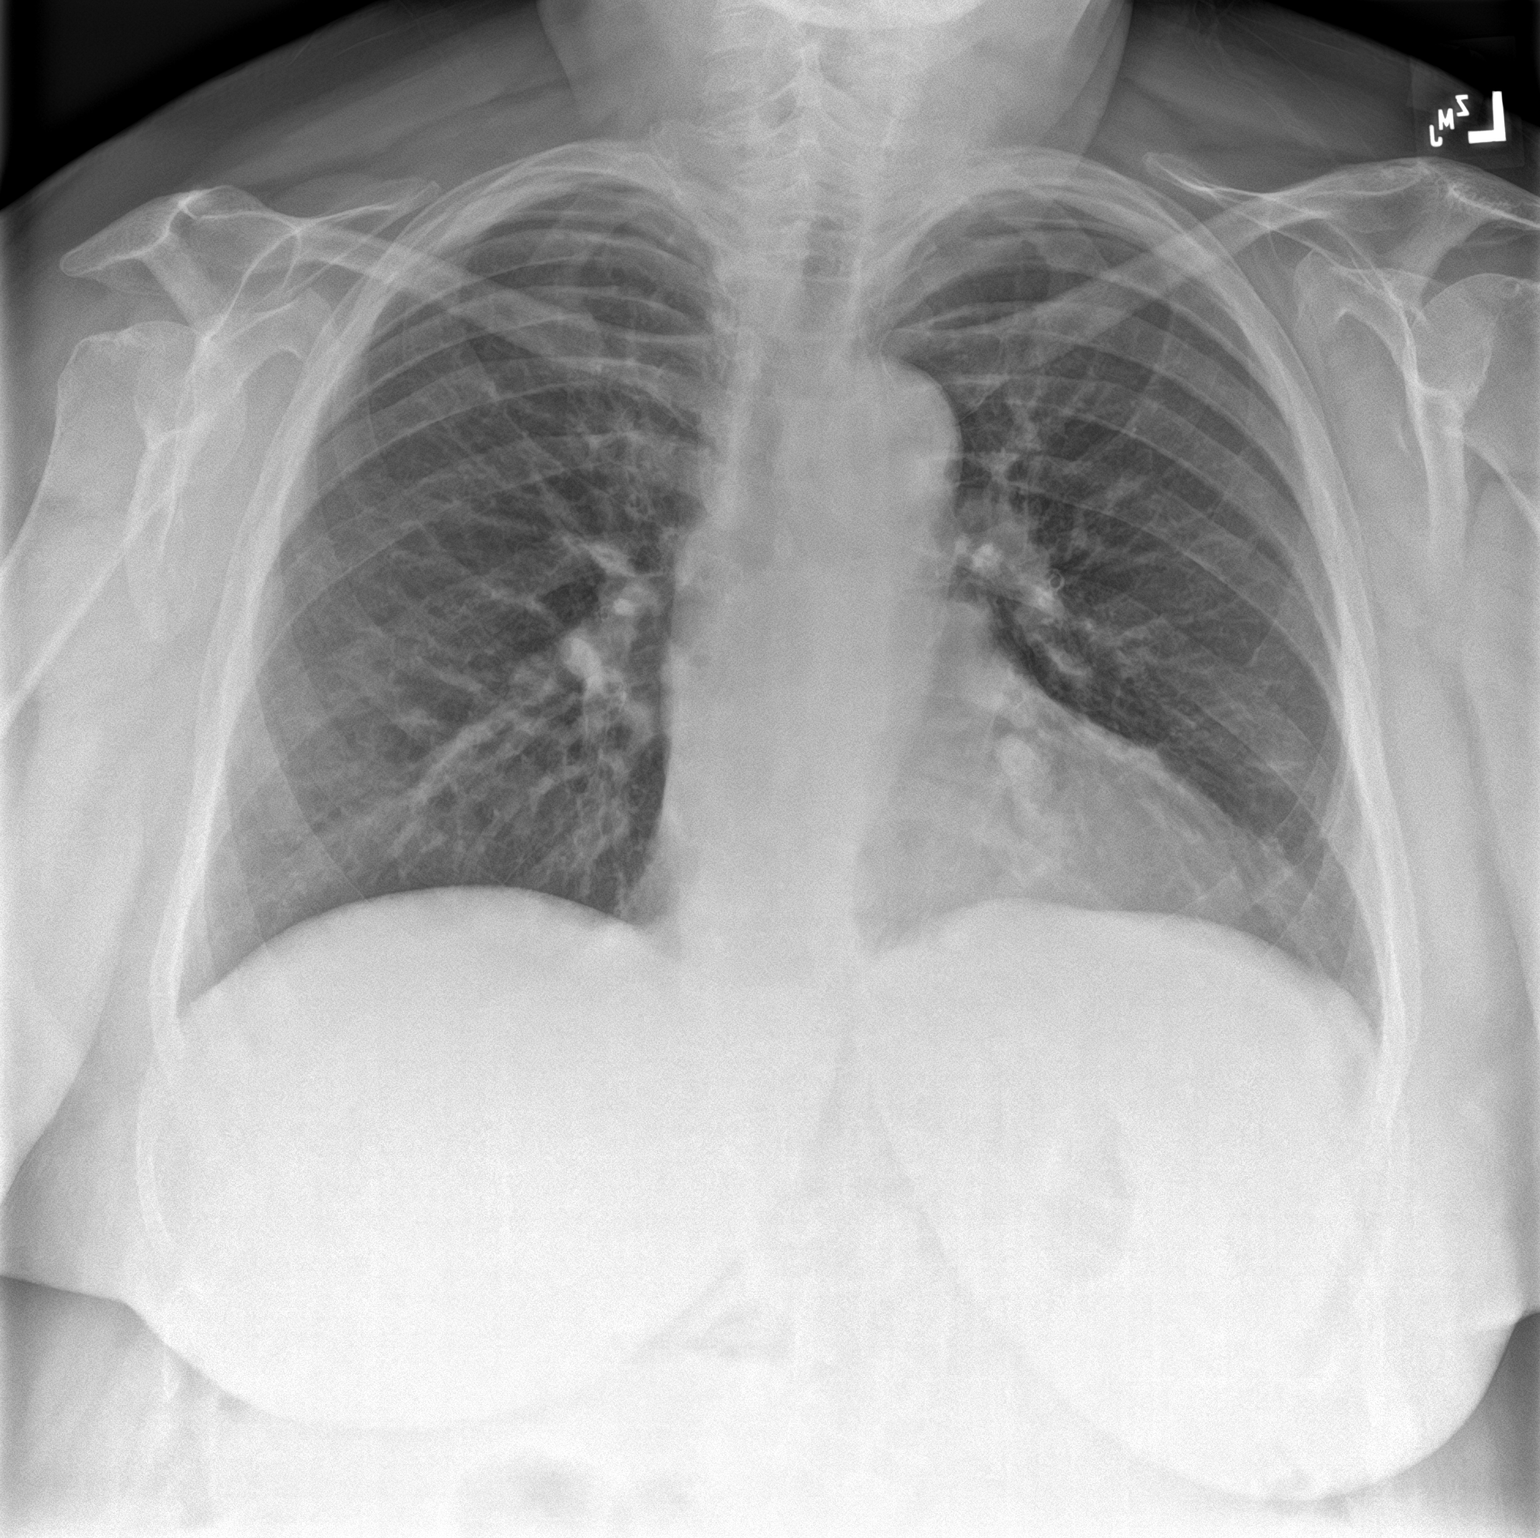

[chest lat]
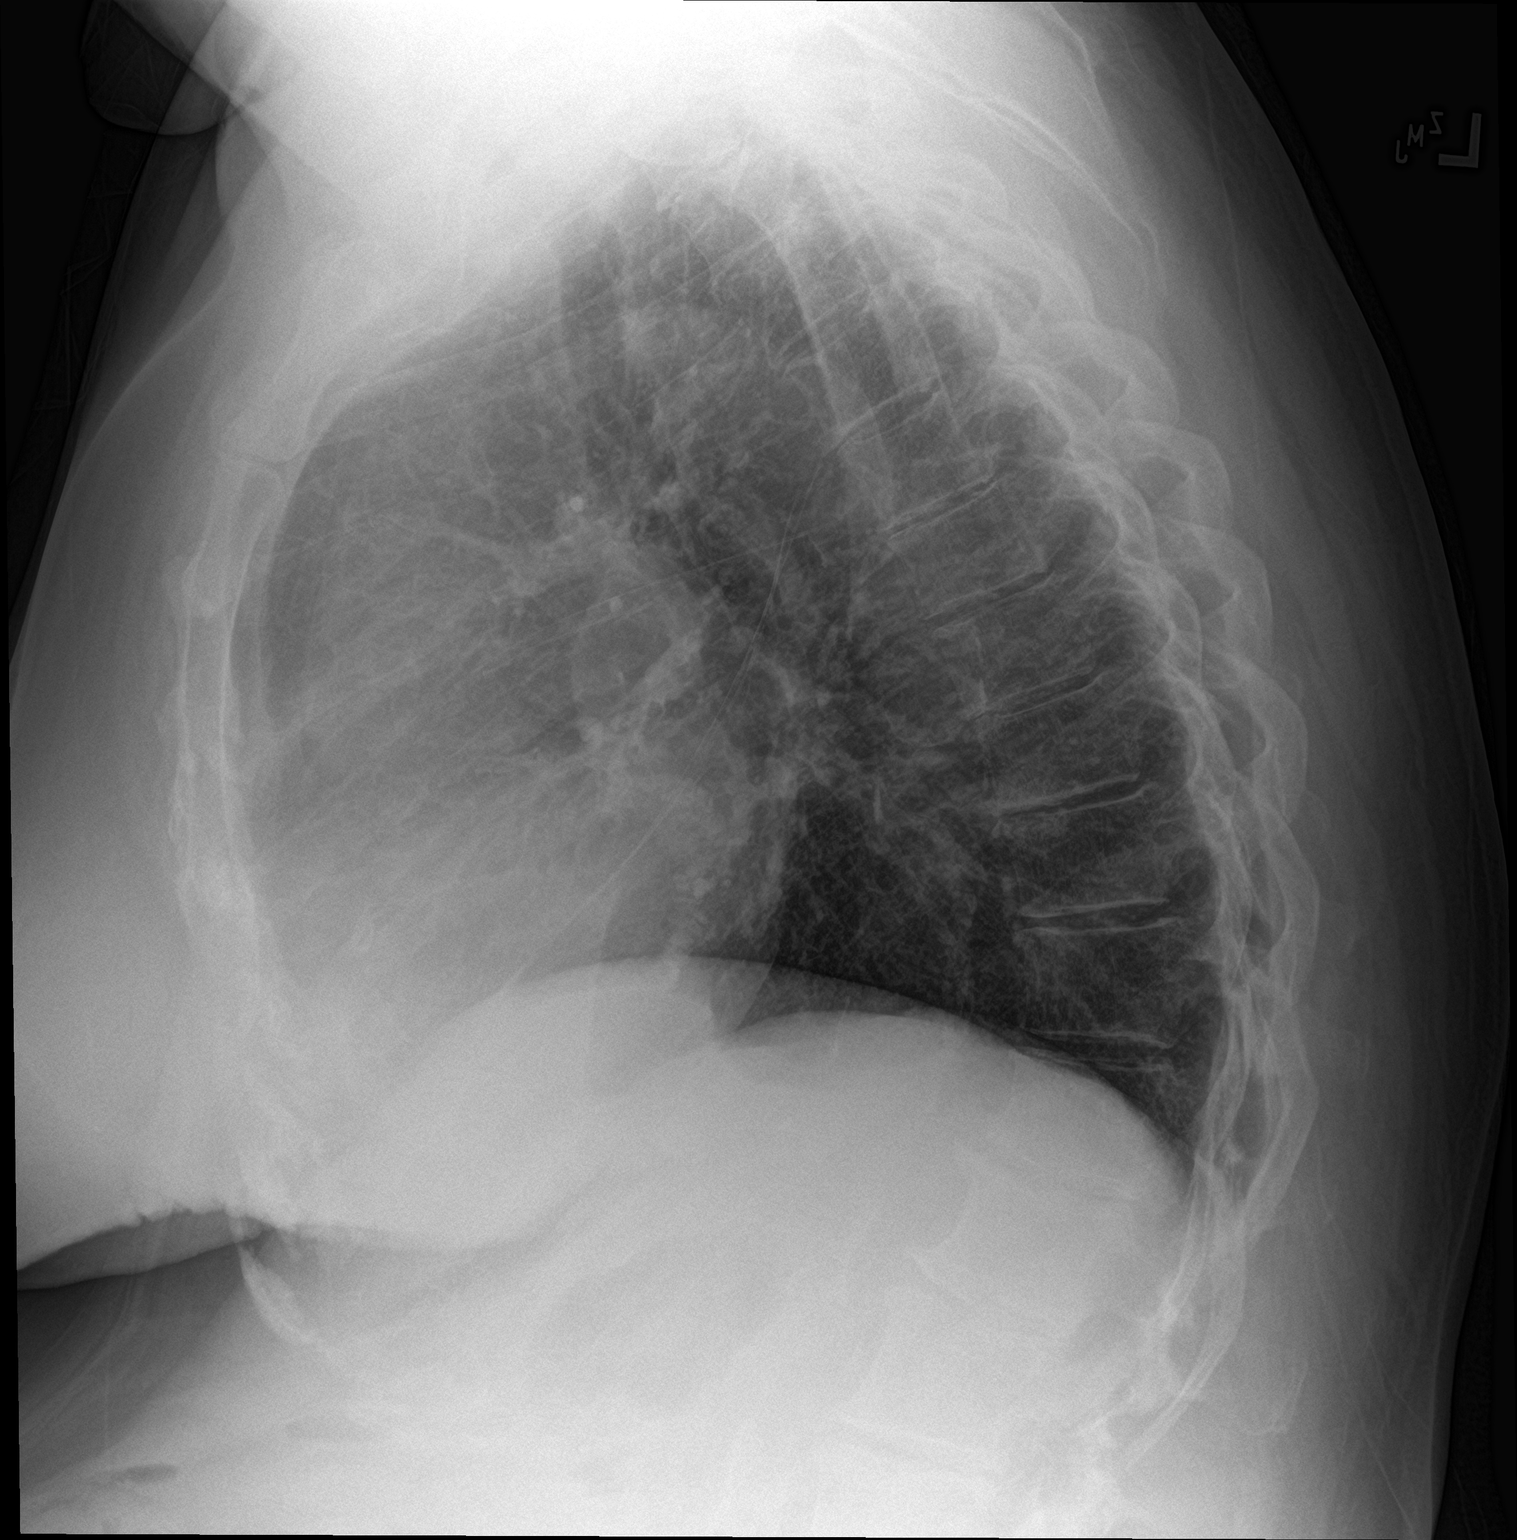

[2 of 2 positions shown; findings below may reference images not displayed]

FINDINGS: Unchanged cardiomediastinal silhouette. There is no focal airspace
consolidation. There is no large pleural effusion or visible
pneumothorax. Thoracic spondylosis. No acute osseous abnormality
IMPRESSION: No evidence of acute cardiopulmonary disease.

## 2023-03-08 DIAGNOSIS — G4733 Obstructive sleep apnea (adult) (pediatric): Secondary | ICD-10-CM | POA: Diagnosis not present

## 2023-03-12 DIAGNOSIS — M3505 Sjogren syndrome with inflammatory arthritis: Secondary | ICD-10-CM | POA: Diagnosis not present

## 2023-03-12 DIAGNOSIS — M797 Fibromyalgia: Secondary | ICD-10-CM | POA: Diagnosis not present

## 2023-03-12 DIAGNOSIS — Z79899 Other long term (current) drug therapy: Secondary | ICD-10-CM | POA: Diagnosis not present

## 2023-03-20 DIAGNOSIS — M1990 Unspecified osteoarthritis, unspecified site: Secondary | ICD-10-CM | POA: Diagnosis not present

## 2023-03-20 DIAGNOSIS — Z7982 Long term (current) use of aspirin: Secondary | ICD-10-CM | POA: Diagnosis not present

## 2023-03-20 DIAGNOSIS — E1142 Type 2 diabetes mellitus with diabetic polyneuropathy: Secondary | ICD-10-CM | POA: Diagnosis not present

## 2023-03-20 DIAGNOSIS — H409 Unspecified glaucoma: Secondary | ICD-10-CM | POA: Diagnosis not present

## 2023-03-20 DIAGNOSIS — G4733 Obstructive sleep apnea (adult) (pediatric): Secondary | ICD-10-CM | POA: Diagnosis not present

## 2023-03-20 DIAGNOSIS — F339 Major depressive disorder, recurrent, unspecified: Secondary | ICD-10-CM | POA: Diagnosis not present

## 2023-03-20 DIAGNOSIS — G8929 Other chronic pain: Secondary | ICD-10-CM | POA: Diagnosis not present

## 2023-03-20 DIAGNOSIS — J45909 Unspecified asthma, uncomplicated: Secondary | ICD-10-CM | POA: Diagnosis not present

## 2023-03-20 DIAGNOSIS — E785 Hyperlipidemia, unspecified: Secondary | ICD-10-CM | POA: Diagnosis not present

## 2023-03-20 DIAGNOSIS — H9193 Unspecified hearing loss, bilateral: Secondary | ICD-10-CM | POA: Diagnosis not present

## 2023-03-20 DIAGNOSIS — I10 Essential (primary) hypertension: Secondary | ICD-10-CM | POA: Diagnosis not present

## 2023-03-20 DIAGNOSIS — J309 Allergic rhinitis, unspecified: Secondary | ICD-10-CM | POA: Diagnosis not present

## 2023-03-28 DIAGNOSIS — Z79899 Other long term (current) drug therapy: Secondary | ICD-10-CM | POA: Diagnosis not present

## 2023-03-28 DIAGNOSIS — M069 Rheumatoid arthritis, unspecified: Secondary | ICD-10-CM | POA: Diagnosis not present

## 2023-03-28 DIAGNOSIS — H02401 Unspecified ptosis of right eyelid: Secondary | ICD-10-CM | POA: Diagnosis not present

## 2023-03-28 DIAGNOSIS — E119 Type 2 diabetes mellitus without complications: Secondary | ICD-10-CM | POA: Diagnosis not present

## 2023-04-23 DIAGNOSIS — N6459 Other signs and symptoms in breast: Secondary | ICD-10-CM | POA: Diagnosis not present

## 2023-04-23 DIAGNOSIS — R413 Other amnesia: Secondary | ICD-10-CM | POA: Diagnosis not present

## 2023-04-23 DIAGNOSIS — N644 Mastodynia: Secondary | ICD-10-CM | POA: Diagnosis not present

## 2023-04-23 DIAGNOSIS — J309 Allergic rhinitis, unspecified: Secondary | ICD-10-CM | POA: Diagnosis not present

## 2023-04-23 DIAGNOSIS — L659 Nonscarring hair loss, unspecified: Secondary | ICD-10-CM | POA: Diagnosis not present

## 2023-04-23 DIAGNOSIS — E559 Vitamin D deficiency, unspecified: Secondary | ICD-10-CM | POA: Diagnosis not present

## 2023-04-23 DIAGNOSIS — R519 Headache, unspecified: Secondary | ICD-10-CM | POA: Diagnosis not present

## 2023-04-23 DIAGNOSIS — I890 Lymphedema, not elsewhere classified: Secondary | ICD-10-CM | POA: Diagnosis not present

## 2023-04-23 DIAGNOSIS — E785 Hyperlipidemia, unspecified: Secondary | ICD-10-CM | POA: Diagnosis not present

## 2023-04-23 DIAGNOSIS — I10 Essential (primary) hypertension: Secondary | ICD-10-CM | POA: Diagnosis not present

## 2023-04-23 DIAGNOSIS — Z0000 Encounter for general adult medical examination without abnormal findings: Secondary | ICD-10-CM | POA: Diagnosis not present

## 2023-04-23 DIAGNOSIS — E119 Type 2 diabetes mellitus without complications: Secondary | ICD-10-CM | POA: Diagnosis not present

## 2023-04-24 DIAGNOSIS — E785 Hyperlipidemia, unspecified: Secondary | ICD-10-CM | POA: Diagnosis not present

## 2023-04-24 DIAGNOSIS — E119 Type 2 diabetes mellitus without complications: Secondary | ICD-10-CM | POA: Diagnosis not present

## 2023-04-24 DIAGNOSIS — N6459 Other signs and symptoms in breast: Secondary | ICD-10-CM | POA: Diagnosis not present

## 2023-04-24 DIAGNOSIS — R519 Headache, unspecified: Secondary | ICD-10-CM | POA: Diagnosis not present

## 2023-04-24 DIAGNOSIS — I10 Essential (primary) hypertension: Secondary | ICD-10-CM | POA: Diagnosis not present

## 2023-04-26 DIAGNOSIS — R8290 Unspecified abnormal findings in urine: Secondary | ICD-10-CM | POA: Diagnosis not present

## 2023-05-01 ENCOUNTER — Other Ambulatory Visit: Payer: Self-pay | Admitting: Family Medicine

## 2023-05-01 DIAGNOSIS — N6459 Other signs and symptoms in breast: Secondary | ICD-10-CM

## 2023-05-01 DIAGNOSIS — N644 Mastodynia: Secondary | ICD-10-CM

## 2023-05-03 ENCOUNTER — Other Ambulatory Visit: Payer: Self-pay | Admitting: Family Medicine

## 2023-05-03 DIAGNOSIS — R519 Headache, unspecified: Secondary | ICD-10-CM

## 2023-05-03 DIAGNOSIS — Z0000 Encounter for general adult medical examination without abnormal findings: Secondary | ICD-10-CM

## 2023-05-06 ENCOUNTER — Other Ambulatory Visit: Payer: Self-pay | Admitting: Family Medicine

## 2023-05-06 DIAGNOSIS — R519 Headache, unspecified: Secondary | ICD-10-CM

## 2023-05-06 DIAGNOSIS — Z0000 Encounter for general adult medical examination without abnormal findings: Secondary | ICD-10-CM

## 2023-05-07 ENCOUNTER — Other Ambulatory Visit: Payer: PPO

## 2023-05-07 ENCOUNTER — Ambulatory Visit
Admission: RE | Admit: 2023-05-07 | Discharge: 2023-05-07 | Disposition: A | Discharge status: Home / Self Care | Payer: PPO | Source: Ambulatory Visit | Attending: Family Medicine | Admitting: Family Medicine

## 2023-05-07 DIAGNOSIS — R4189 Other symptoms and signs involving cognitive functions and awareness: Secondary | ICD-10-CM | POA: Diagnosis not present

## 2023-05-07 DIAGNOSIS — Z0000 Encounter for general adult medical examination without abnormal findings: Secondary | ICD-10-CM | POA: Insufficient documentation

## 2023-05-07 DIAGNOSIS — Z79899 Other long term (current) drug therapy: Secondary | ICD-10-CM | POA: Diagnosis not present

## 2023-05-07 DIAGNOSIS — R519 Headache, unspecified: Secondary | ICD-10-CM | POA: Insufficient documentation

## 2023-05-07 DIAGNOSIS — R413 Other amnesia: Secondary | ICD-10-CM | POA: Diagnosis not present

## 2023-05-07 DIAGNOSIS — G4733 Obstructive sleep apnea (adult) (pediatric): Secondary | ICD-10-CM | POA: Diagnosis not present

## 2023-05-15 ENCOUNTER — Ambulatory Visit
Admission: RE | Admit: 2023-05-15 | Discharge: 2023-05-15 | Disposition: A | Discharge status: Home / Self Care | Payer: PPO | Source: Ambulatory Visit | Attending: Family Medicine | Admitting: Family Medicine

## 2023-05-15 DIAGNOSIS — Q670 Congenital facial asymmetry: Secondary | ICD-10-CM | POA: Diagnosis not present

## 2023-05-15 DIAGNOSIS — N6459 Other signs and symptoms in breast: Secondary | ICD-10-CM | POA: Insufficient documentation

## 2023-05-15 DIAGNOSIS — N644 Mastodynia: Secondary | ICD-10-CM | POA: Insufficient documentation

## 2023-05-15 DIAGNOSIS — R21 Rash and other nonspecific skin eruption: Secondary | ICD-10-CM | POA: Diagnosis not present

## 2023-05-15 DIAGNOSIS — R92323 Mammographic fibroglandular density, bilateral breasts: Secondary | ICD-10-CM | POA: Diagnosis not present

## 2023-07-11 DIAGNOSIS — H02401 Unspecified ptosis of right eyelid: Secondary | ICD-10-CM | POA: Diagnosis not present

## 2023-07-18 DIAGNOSIS — H02401 Unspecified ptosis of right eyelid: Secondary | ICD-10-CM | POA: Diagnosis not present

## 2023-07-31 DIAGNOSIS — F02A0 Dementia in other diseases classified elsewhere, mild, without behavioral disturbance, psychotic disturbance, mood disturbance, and anxiety: Secondary | ICD-10-CM | POA: Diagnosis not present

## 2023-07-31 DIAGNOSIS — G4733 Obstructive sleep apnea (adult) (pediatric): Secondary | ICD-10-CM | POA: Diagnosis not present

## 2023-08-02 ENCOUNTER — Other Ambulatory Visit: Payer: Self-pay | Admitting: Family Medicine

## 2023-08-02 DIAGNOSIS — M79605 Pain in left leg: Secondary | ICD-10-CM

## 2023-10-28 DIAGNOSIS — M064 Inflammatory polyarthropathy: Secondary | ICD-10-CM | POA: Diagnosis not present

## 2023-10-28 DIAGNOSIS — Z79899 Other long term (current) drug therapy: Secondary | ICD-10-CM | POA: Diagnosis not present

## 2023-10-28 DIAGNOSIS — M3505 Sjogren syndrome with inflammatory arthritis: Secondary | ICD-10-CM | POA: Diagnosis not present

## 2023-10-28 DIAGNOSIS — R682 Dry mouth, unspecified: Secondary | ICD-10-CM | POA: Diagnosis not present

## 2023-12-19 DIAGNOSIS — H02401 Unspecified ptosis of right eyelid: Secondary | ICD-10-CM | POA: Diagnosis not present

## 2024-03-31 DIAGNOSIS — Z79899 Other long term (current) drug therapy: Secondary | ICD-10-CM | POA: Diagnosis not present

## 2024-04-09 DIAGNOSIS — M069 Rheumatoid arthritis, unspecified: Secondary | ICD-10-CM | POA: Diagnosis not present

## 2024-04-09 DIAGNOSIS — E119 Type 2 diabetes mellitus without complications: Secondary | ICD-10-CM | POA: Diagnosis not present

## 2024-04-09 DIAGNOSIS — H26493 Other secondary cataract, bilateral: Secondary | ICD-10-CM | POA: Diagnosis not present

## 2024-04-09 DIAGNOSIS — Z79899 Other long term (current) drug therapy: Secondary | ICD-10-CM | POA: Diagnosis not present

## 2024-04-29 DIAGNOSIS — M064 Inflammatory polyarthropathy: Secondary | ICD-10-CM | POA: Diagnosis not present

## 2024-04-29 DIAGNOSIS — Z79899 Other long term (current) drug therapy: Secondary | ICD-10-CM | POA: Diagnosis not present

## 2024-04-29 DIAGNOSIS — M3505 Sjogren syndrome with inflammatory arthritis: Secondary | ICD-10-CM | POA: Diagnosis not present

## 2024-04-29 DIAGNOSIS — R682 Dry mouth, unspecified: Secondary | ICD-10-CM | POA: Diagnosis not present

## 2024-06-26 NOTE — Progress Notes (Addendum)
 Carrie Wong                                          MRN: [id number]   06/26/2024   The [facility] Quality Team Specialist reviewed this patient medical record for the purposes of chart review for care gap closure. The following were reviewed: chart review for care gap closure-kidney health evaluation for diabetes:eGFR  and uACR.  09/10/2024- cannot close ked 2025    [facility] Quality Team

## 2024-08-25 ENCOUNTER — Encounter: Payer: Self-pay | Admitting: Family Medicine
